# Patient Record
Sex: Female | Born: 1940
Health system: Southern US, Community
[De-identification: ages and names within clinical notes are randomized; demographics above are authoritative.]

## PROBLEM LIST (undated history)

## (undated) DIAGNOSIS — M1990 Unspecified osteoarthritis, unspecified site: Secondary | ICD-10-CM

## (undated) DIAGNOSIS — H269 Unspecified cataract: Secondary | ICD-10-CM

## (undated) DIAGNOSIS — R011 Cardiac murmur, unspecified: Secondary | ICD-10-CM

## (undated) DIAGNOSIS — T7840XA Allergy, unspecified, initial encounter: Secondary | ICD-10-CM

## (undated) DIAGNOSIS — M3500 Sicca syndrome, unspecified: Secondary | ICD-10-CM

## (undated) DIAGNOSIS — I061 Rheumatic aortic insufficiency: Secondary | ICD-10-CM

## (undated) HISTORY — DX: Unspecified osteoarthritis, unspecified site: M19.90

## (undated) HISTORY — DX: Allergy, unspecified, initial encounter: T78.40XA

## (undated) HISTORY — DX: Cardiac murmur, unspecified: R01.1

## (undated) HISTORY — PX: ABDOMINAL HYSTERECTOMY: SHX81

## (undated) HISTORY — DX: Unspecified cataract: H26.9

## (undated) HISTORY — DX: Sjogren syndrome, unspecified: M35.00

---

## 2005-02-21 ENCOUNTER — Emergency Department: Payer: Self-pay | Admitting: Emergency Medicine

## 2005-04-22 ENCOUNTER — Ambulatory Visit: Payer: Self-pay | Admitting: Internal Medicine

## 2005-07-19 HISTORY — PX: BREAST BIOPSY: SHX20

## 2005-11-03 ENCOUNTER — Ambulatory Visit: Payer: Self-pay | Admitting: Internal Medicine

## 2006-05-30 ENCOUNTER — Ambulatory Visit: Payer: Self-pay | Admitting: Family Medicine

## 2006-07-04 ENCOUNTER — Ambulatory Visit: Payer: Self-pay | Admitting: Internal Medicine

## 2006-08-01 ENCOUNTER — Ambulatory Visit: Payer: Self-pay

## 2007-09-21 ENCOUNTER — Ambulatory Visit: Payer: Self-pay | Admitting: Internal Medicine

## 2008-10-31 ENCOUNTER — Ambulatory Visit: Payer: Self-pay | Admitting: Internal Medicine

## 2010-03-22 ENCOUNTER — Emergency Department: Payer: Self-pay | Admitting: Unknown Physician Specialty

## 2010-03-22 ENCOUNTER — Ambulatory Visit: Payer: Self-pay | Admitting: Ophthalmology

## 2010-04-08 ENCOUNTER — Ambulatory Visit: Payer: Self-pay | Admitting: Internal Medicine

## 2011-08-04 ENCOUNTER — Ambulatory Visit: Payer: Self-pay | Admitting: Family Medicine

## 2011-08-10 ENCOUNTER — Ambulatory Visit: Payer: Self-pay | Admitting: Family Medicine

## 2011-08-30 ENCOUNTER — Ambulatory Visit: Payer: Self-pay | Admitting: Gastroenterology

## 2012-02-09 ENCOUNTER — Ambulatory Visit: Payer: Self-pay | Admitting: Family Medicine

## 2013-09-01 ENCOUNTER — Emergency Department: Payer: Self-pay | Admitting: Emergency Medicine

## 2013-11-12 ENCOUNTER — Ambulatory Visit (INDEPENDENT_AMBULATORY_CARE_PROVIDER_SITE_OTHER): Payer: Medicare Other | Admitting: Podiatry

## 2013-11-12 ENCOUNTER — Encounter: Payer: Self-pay | Admitting: Podiatry

## 2013-11-12 ENCOUNTER — Other Ambulatory Visit: Payer: Self-pay | Admitting: *Deleted

## 2013-11-12 ENCOUNTER — Ambulatory Visit (INDEPENDENT_AMBULATORY_CARE_PROVIDER_SITE_OTHER): Payer: Medicare Other

## 2013-11-12 VITALS — BP 118/64 | HR 94 | Resp 16 | Ht 58.5 in | Wt 106.0 lb

## 2013-11-12 DIAGNOSIS — M21619 Bunion of unspecified foot: Secondary | ICD-10-CM

## 2013-11-12 DIAGNOSIS — M7751 Other enthesopathy of right foot: Secondary | ICD-10-CM

## 2013-11-12 DIAGNOSIS — M7750 Other enthesopathy of unspecified foot: Secondary | ICD-10-CM

## 2013-11-12 DIAGNOSIS — M2040 Other hammer toe(s) (acquired), unspecified foot: Secondary | ICD-10-CM

## 2013-11-12 NOTE — Progress Notes (Signed)
   Subjective:    Patient ID: Angelica Whitney, female    DOB: 04-16-1941, 73 y.o.   MRN: [id number]  HPI Comments: i have a painful corn on my right 2nd toe . Also have bunions and hammertoes . Its been a while ,just getting worse      Review of Systems  Constitutional: Positive for activity change and appetite change.  All other systems reviewed and are negative.      Objective:   Physical Exam: I have reviewed her past history medications allergies surgeries social history and review of systems. Pulses are strongly palpable bilateral. Neurologic sensorium is intact per since once the monofilament. Deep tendon reflexes are intact and brisk bilateral. Muscle strength is 5 over 5 dorsiflexors plantar flexors inverters everters all intrinsic musculature is intact. Orthopedic evaluation does demonstrates severe pes planus bilateral moderate to severe hallux abductovalgus deformity bilateral. Overlapping second toe of the hallux left foot. She has a reactive hyperkeratosis capsulitis overlying the medial DIPJ of the second digit of the right foot digit is juxtaposition of the hallux. Radiographic evaluation does demonstrate severe pes planus. Osteoporosis at the very least osteopenia. And digital deformities.        Assessment & Plan:  Assessment: Severe foot deformity bilateral. Capsulitis DIPJ second digit right foot with overlying reactive hyperkeratosis.  Plan: Injected the area today with dexamethasone and local anesthetic debrided the reactive hyperkeratosis and placed padding discussed the possible need for surgical intervention.

## 2014-01-02 ENCOUNTER — Emergency Department: Payer: Self-pay | Admitting: Internal Medicine

## 2014-01-29 ENCOUNTER — Emergency Department: Payer: Self-pay | Admitting: Emergency Medicine

## 2014-03-21 ENCOUNTER — Emergency Department: Payer: Self-pay | Admitting: Emergency Medicine

## 2014-03-22 ENCOUNTER — Emergency Department: Payer: Self-pay | Admitting: Emergency Medicine

## 2014-03-24 ENCOUNTER — Emergency Department: Payer: Self-pay | Admitting: Emergency Medicine

## 2014-03-24 LAB — CBC
HCT: 35.8 % (ref 35.0–47.0)
HGB: 11.8 g/dL — ABNORMAL LOW (ref 12.0–16.0)
MCH: 33.7 pg (ref 26.0–34.0)
MCHC: 32.9 g/dL (ref 32.0–36.0)
MCV: 103 fL — ABNORMAL HIGH (ref 80–100)
Platelet: 238 10*3/uL (ref 150–440)
RBC: 3.49 10*6/uL — ABNORMAL LOW (ref 3.80–5.20)
RDW: 17.8 % — ABNORMAL HIGH (ref 11.5–14.5)
WBC: 7.5 10*3/uL (ref 3.6–11.0)

## 2014-03-24 LAB — BASIC METABOLIC PANEL
Anion Gap: 5 — ABNORMAL LOW (ref 7–16)
BUN: 9 mg/dL (ref 7–18)
CHLORIDE: 104 mmol/L (ref 98–107)
CO2: 26 mmol/L (ref 21–32)
Calcium, Total: 8.3 mg/dL — ABNORMAL LOW (ref 8.5–10.1)
Creatinine: 0.88 mg/dL (ref 0.60–1.30)
EGFR (African American): >60
Glucose: 100 mg/dL — ABNORMAL HIGH (ref 65–99)
Osmolality: 269 (ref 275–301)
Potassium: 3.8 mmol/L (ref 3.5–5.1)
Sodium: 135 mmol/L — ABNORMAL LOW (ref 136–145)

## 2014-03-24 LAB — TROPONIN I: Troponin-I: <0.02 ng/mL

## 2014-04-06 ENCOUNTER — Emergency Department: Payer: Self-pay | Admitting: Emergency Medicine

## 2014-05-13 ENCOUNTER — Ambulatory Visit: Payer: Self-pay | Admitting: Family Medicine

## 2015-06-26 ENCOUNTER — Other Ambulatory Visit: Payer: Self-pay | Admitting: Family Medicine

## 2015-06-26 DIAGNOSIS — Z1231 Encounter for screening mammogram for malignant neoplasm of breast: Secondary | ICD-10-CM

## 2015-07-03 DIAGNOSIS — M069 Rheumatoid arthritis, unspecified: Secondary | ICD-10-CM | POA: Insufficient documentation

## 2015-07-03 DIAGNOSIS — M3500 Sicca syndrome, unspecified: Secondary | ICD-10-CM | POA: Insufficient documentation

## 2015-07-08 ENCOUNTER — Ambulatory Visit
Admission: RE | Admit: 2015-07-08 | Discharge: 2015-07-08 | Disposition: A | Discharge status: Home / Self Care | Payer: PPO | Source: Ambulatory Visit | Attending: Family Medicine | Admitting: Family Medicine

## 2015-07-08 DIAGNOSIS — Z1231 Encounter for screening mammogram for malignant neoplasm of breast: Secondary | ICD-10-CM

## 2015-08-13 DIAGNOSIS — Z79899 Other long term (current) drug therapy: Secondary | ICD-10-CM | POA: Diagnosis not present

## 2015-08-13 DIAGNOSIS — M069 Rheumatoid arthritis, unspecified: Secondary | ICD-10-CM | POA: Diagnosis not present

## 2015-09-10 DIAGNOSIS — Z79899 Other long term (current) drug therapy: Secondary | ICD-10-CM | POA: Diagnosis not present

## 2015-09-10 DIAGNOSIS — M0579 Rheumatoid arthritis with rheumatoid factor of multiple sites without organ or systems involvement: Secondary | ICD-10-CM | POA: Diagnosis not present

## 2015-09-10 DIAGNOSIS — M3500 Sicca syndrome, unspecified: Secondary | ICD-10-CM | POA: Diagnosis not present

## 2015-09-10 DIAGNOSIS — M1990 Unspecified osteoarthritis, unspecified site: Secondary | ICD-10-CM | POA: Diagnosis not present

## 2015-09-10 DIAGNOSIS — I7300 Raynaud's syndrome without gangrene: Secondary | ICD-10-CM | POA: Diagnosis not present

## 2015-10-10 DIAGNOSIS — Z79899 Other long term (current) drug therapy: Secondary | ICD-10-CM | POA: Diagnosis not present

## 2015-10-10 DIAGNOSIS — M0579 Rheumatoid arthritis with rheumatoid factor of multiple sites without organ or systems involvement: Secondary | ICD-10-CM | POA: Diagnosis not present

## 2015-11-17 DIAGNOSIS — H16223 Keratoconjunctivitis sicca, not specified as Sjogren's, bilateral: Secondary | ICD-10-CM | POA: Diagnosis not present

## 2015-11-17 DIAGNOSIS — H04123 Dry eye syndrome of bilateral lacrimal glands: Secondary | ICD-10-CM | POA: Diagnosis not present

## 2015-11-17 DIAGNOSIS — H2513 Age-related nuclear cataract, bilateral: Secondary | ICD-10-CM | POA: Diagnosis not present

## 2015-11-17 DIAGNOSIS — E119 Type 2 diabetes mellitus without complications: Secondary | ICD-10-CM | POA: Diagnosis not present

## 2015-11-18 DIAGNOSIS — H2012 Chronic iridocyclitis, left eye: Secondary | ICD-10-CM | POA: Diagnosis not present

## 2015-12-23 DIAGNOSIS — R7303 Prediabetes: Secondary | ICD-10-CM | POA: Diagnosis not present

## 2015-12-23 DIAGNOSIS — Z8639 Personal history of other endocrine, nutritional and metabolic disease: Secondary | ICD-10-CM | POA: Diagnosis not present

## 2015-12-23 DIAGNOSIS — E782 Mixed hyperlipidemia: Secondary | ICD-10-CM | POA: Diagnosis not present

## 2015-12-25 DIAGNOSIS — R7303 Prediabetes: Secondary | ICD-10-CM | POA: Diagnosis not present

## 2015-12-25 DIAGNOSIS — E7800 Pure hypercholesterolemia, unspecified: Secondary | ICD-10-CM | POA: Diagnosis not present

## 2015-12-25 DIAGNOSIS — Z8639 Personal history of other endocrine, nutritional and metabolic disease: Secondary | ICD-10-CM | POA: Diagnosis not present

## 2016-01-15 DIAGNOSIS — M3500 Sicca syndrome, unspecified: Secondary | ICD-10-CM | POA: Diagnosis not present

## 2016-01-15 DIAGNOSIS — I7300 Raynaud's syndrome without gangrene: Secondary | ICD-10-CM | POA: Diagnosis not present

## 2016-01-15 DIAGNOSIS — Z7952 Long term (current) use of systemic steroids: Secondary | ICD-10-CM | POA: Diagnosis not present

## 2016-01-15 DIAGNOSIS — E7800 Pure hypercholesterolemia, unspecified: Secondary | ICD-10-CM | POA: Diagnosis not present

## 2016-01-15 DIAGNOSIS — R700 Elevated erythrocyte sedimentation rate: Secondary | ICD-10-CM | POA: Diagnosis not present

## 2016-01-15 DIAGNOSIS — M0579 Rheumatoid arthritis with rheumatoid factor of multiple sites without organ or systems involvement: Secondary | ICD-10-CM | POA: Diagnosis not present

## 2016-01-15 DIAGNOSIS — R7303 Prediabetes: Secondary | ICD-10-CM | POA: Diagnosis not present

## 2016-02-06 DIAGNOSIS — R300 Dysuria: Secondary | ICD-10-CM | POA: Diagnosis not present

## 2016-04-07 DIAGNOSIS — L03119 Cellulitis of unspecified part of limb: Secondary | ICD-10-CM | POA: Diagnosis not present

## 2016-04-07 DIAGNOSIS — L02619 Cutaneous abscess of unspecified foot: Secondary | ICD-10-CM | POA: Diagnosis not present

## 2016-04-07 DIAGNOSIS — M2011 Hallux valgus (acquired), right foot: Secondary | ICD-10-CM | POA: Diagnosis not present

## 2016-04-07 DIAGNOSIS — L97511 Non-pressure chronic ulcer of other part of right foot limited to breakdown of skin: Secondary | ICD-10-CM | POA: Diagnosis not present

## 2016-04-21 DIAGNOSIS — L02619 Cutaneous abscess of unspecified foot: Secondary | ICD-10-CM | POA: Diagnosis not present

## 2016-04-21 DIAGNOSIS — L97511 Non-pressure chronic ulcer of other part of right foot limited to breakdown of skin: Secondary | ICD-10-CM | POA: Diagnosis not present

## 2016-04-21 DIAGNOSIS — L03119 Cellulitis of unspecified part of limb: Secondary | ICD-10-CM | POA: Diagnosis not present

## 2016-05-05 DIAGNOSIS — M79674 Pain in right toe(s): Secondary | ICD-10-CM | POA: Diagnosis not present

## 2016-05-05 DIAGNOSIS — L02619 Cutaneous abscess of unspecified foot: Secondary | ICD-10-CM | POA: Diagnosis not present

## 2016-05-05 DIAGNOSIS — M79675 Pain in left toe(s): Secondary | ICD-10-CM | POA: Diagnosis not present

## 2016-05-05 DIAGNOSIS — B351 Tinea unguium: Secondary | ICD-10-CM | POA: Diagnosis not present

## 2016-05-05 DIAGNOSIS — L03119 Cellulitis of unspecified part of limb: Secondary | ICD-10-CM | POA: Diagnosis not present

## 2016-05-19 DIAGNOSIS — L97511 Non-pressure chronic ulcer of other part of right foot limited to breakdown of skin: Secondary | ICD-10-CM | POA: Diagnosis not present

## 2016-06-07 DIAGNOSIS — M79671 Pain in right foot: Secondary | ICD-10-CM | POA: Diagnosis not present

## 2016-06-07 DIAGNOSIS — M67479 Ganglion, unspecified ankle and foot: Secondary | ICD-10-CM | POA: Diagnosis not present

## 2016-06-07 DIAGNOSIS — L97511 Non-pressure chronic ulcer of other part of right foot limited to breakdown of skin: Secondary | ICD-10-CM | POA: Diagnosis not present

## 2016-06-14 DIAGNOSIS — R7303 Prediabetes: Secondary | ICD-10-CM | POA: Diagnosis not present

## 2016-06-14 DIAGNOSIS — Z8639 Personal history of other endocrine, nutritional and metabolic disease: Secondary | ICD-10-CM | POA: Diagnosis not present

## 2016-06-14 DIAGNOSIS — E7800 Pure hypercholesterolemia, unspecified: Secondary | ICD-10-CM | POA: Diagnosis not present

## 2016-06-15 ENCOUNTER — Other Ambulatory Visit: Payer: Self-pay | Admitting: Family Medicine

## 2016-06-15 DIAGNOSIS — Z1231 Encounter for screening mammogram for malignant neoplasm of breast: Secondary | ICD-10-CM

## 2016-06-21 ENCOUNTER — Inpatient Hospital Stay: Admission: RE | Admit: 2016-06-21 | Payer: PPO | Source: Ambulatory Visit

## 2016-06-23 ENCOUNTER — Encounter: Payer: PPO | Attending: Internal Medicine | Admitting: Internal Medicine

## 2016-06-23 DIAGNOSIS — M3500 Sicca syndrome, unspecified: Secondary | ICD-10-CM | POA: Diagnosis not present

## 2016-06-23 DIAGNOSIS — M67471 Ganglion, right ankle and foot: Secondary | ICD-10-CM | POA: Diagnosis not present

## 2016-06-23 DIAGNOSIS — Z885 Allergy status to narcotic agent status: Secondary | ICD-10-CM | POA: Insufficient documentation

## 2016-06-23 DIAGNOSIS — M064 Inflammatory polyarthropathy: Secondary | ICD-10-CM | POA: Diagnosis not present

## 2016-06-23 DIAGNOSIS — D869 Sarcoidosis, unspecified: Secondary | ICD-10-CM | POA: Insufficient documentation

## 2016-06-23 DIAGNOSIS — M21611 Bunion of right foot: Secondary | ICD-10-CM | POA: Insufficient documentation

## 2016-06-23 DIAGNOSIS — E785 Hyperlipidemia, unspecified: Secondary | ICD-10-CM | POA: Diagnosis not present

## 2016-06-23 DIAGNOSIS — M2011 Hallux valgus (acquired), right foot: Secondary | ICD-10-CM | POA: Diagnosis not present

## 2016-06-24 NOTE — Progress Notes (Signed)
GARNETT, GOERTZ ([id number]) Visit Report for 06/23/2016 Chief Complaint Document Details Patient Name: Angelica Whitney, Whitney. Date of Service: 06/23/2016 8:00 AM Medical Record Patient Account Number: [id number] Number: Treating RN: Curtis Sites 10/21/1940 (75 y.o. Other Clinician: Date of Birth/Sex: Female) Treating Jeanette Moffatt Primary Care Physician/Extender: Dixie Dials Physician: Referring Physician: Valera Castle in Treatment: 0 Information Obtained from: Patient Chief Complaint 06/23/16; patient is here for review of a swelling on the lateral aspect of her right great toe, second opinion on same Electronic Signature(s) Signed: 06/23/2016 4:39:43 PM By: Baltazar Najjar MD Entered By: Baltazar Najjar on 06/23/2016 09:22:18 [name], [name] ([id number]) -------------------------------------------------------------------------------- HPI Details Patient Name: Angelica Whitney Whitney. Date of Service: 06/23/2016 8:00 AM Medical Record Patient Account Number: [id number] Number: Treating RN: Curtis Sites [birth year] (75 y.o. Other Clinician: Date of Birth/Sex: Female) Treating Akanksha Bellmore Primary Care Physician/Extender: Dixie Dials Physician: Referring Physician: Valera Castle in Treatment: 0 History of Present Illness HPI Description: 06/23/16; this is a patient who has rheumatoid arthritis and chronic forefoot deformities. She has a hallucis valgus deformity with a bunion at her right first metatarsal phalangeal joint. She also has this on the left but is not had any problem in that area. Over the last several months she is started developed cystic swelling and pain over this area. She developed an ulcer over the lateral aspect of her first metatarsal head on the right She has been followed by her local podiatrist Dr. Liz Beach. She is apparently he had fluid aspiration of this area and cultures although I don't actually  see the results. I see that definitive plans had been made for operative removal of this area however the patient canceled this. The surgery was originally scheduled for the middle of this month. ABIs in this clinic were 0.93. The patient does not have an open area currently although she is complaining of daily lancinating pain in this area. She has cut out the sides of all of her shoes to offload the area. She has had previous surgery on the right fifth toe many years ago. Asian is a borderline diabetic Electronic Signature(s) Signed: 06/23/2016 4:39:43 PM By: Baltazar Najjar MD Entered By: Baltazar Najjar on 06/23/2016 28:78:67 Angelica Whitney Whitney ([id number]) -------------------------------------------------------------------------------- Physical Exam Details Patient Name: Angelica Whitney, Whitney. Date of Service: 06/23/2016 8:00 AM Medical Record Patient Account Number: [id number] Number: Treating RN: Curtis Sites 10-Nov-1940 (75 y.o. Other Clinician: Date of Birth/Sex: Female) Treating Draxton Luu Primary Care Physician/Extender: Dixie Dials Physician: Referring Physician: Valera Castle in Treatment: 0 Constitutional Sitting or standing Blood Pressure is within target range for patient.. Pulse regular and within target range for patient.Marland Kitchen Respirations regular, non-labored and within target range.. Temperature is normal and within the target range for the patient.. H and appears well. Eyes Conjunctivae clear. No discharge.. Ears, Nose, Mouth, and Throat Patient appears to have normal oral mucosa. Respiratory Respiratory effort is easy and symmetric bilaterally. Rate is normal at rest and on room air.. Bilateral breath sounds are clear and equal in all lobes with no wheezes, rales or rhonchi.. Cardiovascular She states she hasn't insufficiency murmur although I did not hear at. Pedal pulses palpable and strong bilaterally.. No changes of venous  insufficiency.. Gastrointestinal (GI) Abdomen is soft and non-distended without masses or tenderness. Bowel sounds active in all quadrants.. Lymphatic None palpable in the popliteal or inguinal area. Musculoskeletal Patient has rheumatoid changes in the left greater than right forefoot with overriding of  the second over the first toe on the left.. Notes Physical exam; the patient has hallucis valgus of the right first toe. On the side of the first metatarsophalangeal is clearly a cystic swelling this is nontender. There is no surrounding erythema no evidence currently of infection. This is probably a ganglion cyst as was previously diagnosed by podiatry. There is no open wound here although I gather there has been one in the past Electronic Signature(s) Signed: 06/23/2016 4:39:43 PM By: Baltazar Najjar MD Entered By: Baltazar Najjar on 06/23/2016 09:43:46 Angelica Whitney Whitney ([id number]) -------------------------------------------------------------------------------- Physician Orders Details Patient Name: Angelica Whitney, Whitney. Date of Service: 06/23/2016 8:00 AM Medical Record Patient Account Number: [id number] Number: Treating RN: Curtis Sites 18-Nov-1940 (75 y.o. Other Clinician: Date of Birth/Sex: Female) Treating Jerrianne Hartin Primary Care Physician/Extender: Dixie Dials Physician: Referring Physician: Valera Castle in Treatment: 0 Verbal / Phone Orders: Yes Clinician: Curtis Sites Read Back and Verified: Yes Diagnosis Coding Discharge From [facility] o Discharge from [facility] - follow up with Dr Alberteen Spindle for bunion/cyst Electronic Signature(s) Signed: 06/23/2016 4:39:21 PM By: Curtis Sites Signed: 06/23/2016 4:39:43 PM By: Baltazar Najjar MD Entered By: Curtis Sites on 06/23/2016 08:46:04 Angelica Whitney Whitney ([id number]) -------------------------------------------------------------------------------- Problem List Details Patient Name:  Angelica Whitney, Whitney. Date of Service: 06/23/2016 8:00 AM Medical Record Patient Account Number: [id number] Number: Treating RN: Curtis Sites 1940/08/01 (75 y.o. Other Clinician: Date of Birth/Sex: Female) Treating Burhanuddin Kohlmann Primary Care Physician/Extender: Dixie Dials Physician: Referring Physician: Valera Castle in Treatment: 0 Active Problems ICD-10 Encounter Code Description Active Date Diagnosis M67.471 Ganglion, right ankle and foot 06/23/2016 Yes M21.611 Bunion of right foot 06/23/2016 Yes Inactive Problems Resolved Problems Electronic Signature(s) Signed: 06/23/2016 4:39:43 PM By: Baltazar Najjar MD Entered By: Baltazar Najjar on 06/23/2016 09:21:43 Angelica Whitney Whitney ([id number]) -------------------------------------------------------------------------------- Progress Note Details Patient Name: Angelica Whitney Whitney. Date of Service: 06/23/2016 8:00 AM Medical Record Patient Account Number: [id number] Number: Treating RN: Curtis Sites Jun 15, 1941 (75 y.o. Other Clinician: Date of Birth/Sex: Female) Treating Catalina Salasar Primary Care Physician/Extender: Dixie Dials Physician: Referring Physician: Valera Castle in Treatment: 0 Subjective Chief Complaint Information obtained from Patient 06/23/16; patient is here for review of a swelling on the lateral aspect of her right great toe, second opinion on same History of Present Illness (HPI) 06/23/16; this is a patient who has rheumatoid arthritis and chronic forefoot deformities. She has a hallucis valgus deformity with a bunion at her right first metatarsal phalangeal joint. She also has this on the left but is not had any problem in that area. Over the last several months she is started developed cystic swelling and pain over this area. She developed an ulcer over the lateral aspect of her first metatarsal head on the right She has been followed by her local  podiatrist Dr. Liz Beach. She is apparently he had fluid aspiration of this area and cultures although I don't actually see the results. I see that definitive plans had been made for operative removal of this area however the patient canceled this. The surgery was originally scheduled for the middle of this month. ABIs in this clinic were 0.93. The patient does not have an open area currently although she is complaining of daily lancinating pain in this area. She has cut out the sides of all of her shoes to offload the area. She has had previous surgery on the right fifth toe many years ago. Asian is a borderline diabetic  Patient History Information obtained from Patient. Allergies codeine (Reaction: nausea), caffeine (Reaction: keeps me awake), Levaquin Social History Never smoker, Marital Status - Divorced, Alcohol Use - Never, Drug Use - No History, Caffeine Use - Never. Medical History Immunological Patient has history of Raynaud s Musculoskeletal Patient has history of Rheumatoid Arthritis, Osteoarthritis - inflammatory arthritis Angelica Whitney, Editha K. ([id number]) Medical And Surgical History Notes Cardiovascular hyperlipidemia Endocrine borderline DMII - A1C 5.8 12/23/15 Neurologic Sjogren's syndrome, sarcoidosis Review of Systems (ROS) Constitutional Symptoms (General Health) The patient has no complaints or symptoms. Eyes The patient has no complaints or symptoms. Ear/Nose/Mouth/Throat The patient has no complaints or symptoms. Hematologic/Lymphatic The patient has no complaints or symptoms. Respiratory The patient has no complaints or symptoms. Cardiovascular The patient has no complaints or symptoms. Gastrointestinal The patient has no complaints or symptoms. Endocrine The patient has no complaints or symptoms. Genitourinary The patient has no complaints or symptoms. Immunological The patient has no complaints or symptoms. Integumentary (Skin) The patient has no  complaints or symptoms. Musculoskeletal The patient has no complaints or symptoms. Neurologic The patient has no complaints or symptoms. Oncologic The patient has no complaints or symptoms. Psychiatric The patient has no complaints or symptoms. Objective Constitutional Sitting or standing Blood Pressure is within target range for patient.. Pulse regular and within target range Angelica Whitney, Evelyn K. ([id number]) for patient.Marland Kitchen. Respirations regular, non-labored and within target range.. Temperature is normal and within the target range for the patient.. H and appears well. Vitals Time Taken: 8:13 AM, Height: 59 in, Source: Stated, Weight: 98 lbs, Source: Stated, BMI: 19.8, Temperature: 98.0 F, Pulse: 78 bpm, Respiratory Rate: 18 breaths/min, Blood Pressure: 125/59 mmHg. Eyes Conjunctivae clear. No discharge.. Ears, Nose, Mouth, and Throat Patient appears to have normal oral mucosa. Respiratory Respiratory effort is easy and symmetric bilaterally. Rate is normal at rest and on room air.. Bilateral breath sounds are clear and equal in all lobes with no wheezes, rales or rhonchi.. Cardiovascular She states she hasn't insufficiency murmur although I did not hear at. Pedal pulses palpable and strong bilaterally.. No changes of venous insufficiency.. Gastrointestinal (GI) Abdomen is soft and non-distended without masses or tenderness. Bowel sounds active in all quadrants.. Lymphatic None palpable in the popliteal or inguinal area. Musculoskeletal Patient has rheumatoid changes in the left greater than right forefoot with overriding of the second over the first toe on the left.. General Notes: Physical exam; the patient has hallucis valgus of the right first toe. On the side of the first metatarsophalangeal is clearly a cystic swelling this is nontender. There is no surrounding erythema no evidence currently of infection. This is probably a ganglion cyst as was previously diagnosed by  podiatry. There is no open wound here although I gather there has been one in the past Assessment Active Problems ICD-10 M67.471 - Ganglion, right ankle and foot M21.611 - Bunion of right foot Angelica Whitney NewcomerVANS, Arianis K. 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 Now with a cystic swelling over the top of this. I reviewed with her podiatrist notes. This was felt to be infected when she first came to the clinic in October. Fluid was aspirated at least once. She also appeared to have an open wound over this area although she has none now. X-ray revealed hallucis valgus deformity. Some increase in soft tissue contour in density. No evidence of any fracture or destructive lesions.  This was done in the podiatry office. #2 patient continues to complain of pain and some tenderness in this area. She has cut out the relevant part of her shoes. It sounds as though this has been problematic enough to warrant surgical excision of the cyst as had been previously planned. I see really no reason to delay this although it does not have any particular urgency. #3 all of this explained to the patient in detail. She does not have a wound currently I think she is getting good care in the podiatrist's office. I think we reassured her and answered all her questions. There is no reason for her to follow here Electronic Signature(s) Signed: 06/23/2016 4:39:43 PM By: Baltazar Najjar MD Entered By: Baltazar Najjar on 06/23/2016 09:46:03 Angelica Whitney Whitney ([id number]) -------------------------------------------------------------------------------- ROS/PFSH Details Patient Name: Angelica Whitney, Whitney. Date of Service: 06/23/2016 8:00 AM Medical Record Patient Account Number: [id number] Number: Treating RN: Curtis Sites [birth year] (75 y.o. Other Clinician: Date of  Birth/Sex: Female) Treating Jannine Abreu Primary Care Physician/Extender: Dixie Dials Physician: Referring Physician: Valera Castle in Treatment: 0 Information Obtained From Patient Wound History Do you currently have one or more open woundso No Constitutional Symptoms (General Health) Complaints and Symptoms: No Complaints or Symptoms Eyes Complaints and Symptoms: No Complaints or Symptoms Ear/Nose/Mouth/Throat Complaints and Symptoms: No Complaints or Symptoms Hematologic/Lymphatic Complaints and Symptoms: No Complaints or Symptoms Respiratory Complaints and Symptoms: No Complaints or Symptoms Cardiovascular Complaints and Symptoms: No Complaints or Symptoms Medical History: Past Medical History Notes: hyperlipidemia Gastrointestinal Angelica Whitney, Kristianne K. ([id number]) Complaints and Symptoms: No Complaints or Symptoms Endocrine Complaints and Symptoms: No Complaints or Symptoms Medical History: Past Medical History Notes: borderline DMII - A1C 5.8 12/23/15 Genitourinary Complaints and Symptoms: No Complaints or Symptoms Immunological Complaints and Symptoms: No Complaints or Symptoms Medical History: Positive for: Raynaudos Integumentary (Skin) Complaints and Symptoms: No Complaints or Symptoms Musculoskeletal Complaints and Symptoms: No Complaints or Symptoms Medical History: Positive for: Rheumatoid Arthritis; Osteoarthritis - inflammatory arthritis Neurologic Complaints and Symptoms: No Complaints or Symptoms Medical History: Past Medical History Notes: Sjogren's syndrome, sarcoidosis Oncologic Complaints and Symptoms: No Complaints or Symptoms Angelica Whitney Whitney, Dazhane K. ([id number]) Psychiatric Complaints and Symptoms: No Complaints or Symptoms Immunizations Pneumococcal Vaccine: Received Pneumococcal Vaccination: Yes Immunization Notes: up to date Family and Social History Never smoker; Marital Status - Divorced; Alcohol Use:  Never; Drug Use: No History; Caffeine Use: Never; Financial Concerns: No; Food, Clothing or Shelter Needs: No; Support System Lacking: No; Transportation Concerns: No; Advanced Directives: No; Patient does not want information on Advanced Directives Electronic Signature(s) Signed: 06/23/2016 4:39:21 PM By: Curtis Sites Signed: 06/23/2016 4:39:43 PM By: Baltazar Najjar MD Entered By: Curtis Sites on 06/23/2016 08:21:46 Bagnall, [name] ([id number]) -------------------------------------------------------------------------------- SuperBill Details Patient Name: Angelica Whitney Whitney. Date of Service: 06/23/2016 Medical Record Patient Account Number: [id number] Number: Treating RN: Curtis Sites 12/14/1940 (75 y.o. Other Clinician: Date of Birth/Sex: Female) Treating Jacobie Stamey Primary Care Physician/Extender: Dixie Dials Physician: Weeks in Treatment: 0 Referring Physician: Marisue Ivan Diagnosis Coding ICD-10 Codes Code Description M67.471 Ganglion, right ankle and foot M21.611 Bunion of right foot Facility Procedures CPT4 Code: [id number] Description: 99213 - WOUND CARE VISIT-LEV 3 EST PT Modifier: Quantity: 1 Physician Procedures CPT4 Code: [id number] Description: WC PHYS LEVEL 3 o NEW PT ICD-10 Description Diagnosis M67.471 Ganglion, right ankle and foot M21.611 Bunion of right foot Modifier: Quantity: 1 Electronic Signature(s) Signed: 06/23/2016 4:39:43 PM By: Baltazar Najjar MD Entered By: Baltazar Najjar on 06/23/2016 09:47:17

## 2016-06-24 NOTE — Progress Notes (Signed)
Angelica Whitney ([id number]) Visit Report for 06/23/2016 Allergy List Details Patient Name: Angelica Whitney, Angelica Whitney. Date of Service: 06/23/2016 8:00 AM Medical Record Patient Account Number: [id number] Number: Treating RN: Curtis Sites October 25, 1940 (75 y.o. Other Clinician: Date of Birth/Sex: Female) Treating ROBSON, MICHAEL Primary Care Physician/Extender: Dixie Dials Physician: Referring Physician: Valera Castle in Treatment: 0 Allergies Active Allergies codeine Reaction: nausea caffeine Reaction: keeps me awake Levaquin Allergy Notes Electronic Signature(s) Signed: 06/23/2016 4:39:21 PM By: Curtis Sites Entered By: Curtis Sites on 06/23/2016 08:15:42 Aspinall, Trinna Balloon ([id number]) -------------------------------------------------------------------------------- Arrival Information Details Patient Name: Angelica Whitney. Date of Service: 06/23/2016 8:00 AM Medical Record Patient Account Number: [id number] Number: Treating RN: Curtis Sites April 05, 1941 (75 y.o. Other Clinician: Date of Birth/Sex: Female) Treating ROBSON, MICHAEL Primary Care Physician/Extender: Dixie Dials Physician: Referring Physician: Valera Castle in Treatment: 0 Visit Information Patient Arrived: Ambulatory Arrival Time: 08:06 Accompanied By: self Transfer Assistance: None Patient Identification Verified: Yes Secondary Verification Process Yes Completed: Electronic Signature(s) Signed: 06/23/2016 4:39:21 PM By: Curtis Sites Entered By: Curtis Sites on 06/23/2016 08:09:55 Schlotterbeck, Trinna Balloon ([id number]) -------------------------------------------------------------------------------- Clinic Level of Care Assessment Details Patient Name: Angelica Whitney. Date of Service: 06/23/2016 8:00 AM Medical Record Patient Account Number: [id number] Number: Treating RN: Curtis Sites [birth year] (75 y.o. Other Clinician: Date of  Birth/Sex: Female) Treating ROBSON, MICHAEL Primary Care Physician/Extender: Dixie Dials Physician: Referring Physician: Valera Castle in Treatment: 0 Clinic Level of Care Assessment Items TOOL 2 Quantity Score []  - Use when only an EandM is performed on the INITIAL visit 0 ASSESSMENTS - Nursing Assessment / Reassessment X - General Physical Exam (combine w/ comprehensive assessment (listed just 1 20 below) when performed on new pt. evals) X - Comprehensive Assessment (HX, ROS, Risk Assessments, Wounds Hx, etc.) 1 25 ASSESSMENTS - Wound and Skin Assessment / Reassessment []  - Simple Wound Assessment / Reassessment - one wound 0 []  - Complex Wound Assessment / Reassessment - multiple wounds 0 X - Dermatologic / Skin Assessment (not related to wound area) 1 10 ASSESSMENTS - Ostomy and/or Continence Assessment and Care []  - Incontinence Assessment and Management 0 []  - Ostomy Care Assessment and Management (repouching, etc.) 0 PROCESS - Coordination of Care X - Simple Patient / Family Education for ongoing care 1 15 []  - Complex (extensive) Patient / Family Education for ongoing care 0 []  - Staff obtains Chiropractor, Records, Test Results / Process Orders 0 []  - Staff telephones HHA, Nursing Homes / Clarify orders / etc 0 []  - Routine Transfer to another Facility (non-emergent condition) 0 []  - Routine Hospital Admission (non-emergent condition) 0 X - New Admissions / Manufacturing engineer / Ordering NPWT, Apligraf, etc. 1 15 Barona, Tiarrah K. ([id number]) []  - Emergency Hospital Admission (emergent condition) 0 X - Simple Discharge Coordination 1 10 []  - Complex (extensive) Discharge Coordination 0 PROCESS - Special Needs []  - Pediatric / Minor Patient Management 0 []  - Isolation Patient Management 0 []  - Hearing / Language / Visual special needs 0 []  - Assessment of Community assistance (transportation, D/C planning, etc.) 0 []  - Additional assistance / Altered  mentation 0 []  - Support Surface(s) Assessment (bed, cushion, seat, etc.) 0 INTERVENTIONS - Wound Cleansing / Measurement []  - Wound Imaging (photographs - any number of wounds) 0 []  - Wound Tracing (instead of photographs) 0 []  - Simple Wound Measurement - one wound 0 []  - Complex Wound Measurement - multiple wounds 0 []  - Simple Wound Cleansing -  one wound 0 []  - Complex Wound Cleansing - multiple wounds 0 INTERVENTIONS - Wound Dressings []  - Small Wound Dressing one or multiple wounds 0 []  - Medium Wound Dressing one or multiple wounds 0 []  - Large Wound Dressing one or multiple wounds 0 []  - Application of Medications - injection 0 INTERVENTIONS - Miscellaneous []  - External ear exam 0 []  - Specimen Collection (cultures, biopsies, blood, body fluids, etc.) 0 []  - Specimen(s) / Culture(s) sent or taken to Lab for analysis 0 []  - Patient Transfer (multiple staff / Lift / Similar devices) 0 Kleier, Averi K. ( ) []  - Simple Staple / Suture removal (25 or less) 0 []  - Complex Staple / Suture removal (26 or more) 0 []  - Hypo / Hyperglycemic Management (close monitor of Blood Glucose) 0 X - Ankle / Brachial Index (ABI) - do not check if billed separately 1 15 Has the patient been seen at the hospital within the last three years: Yes Total Score: 110 Level Of Care: New/Established - Level 3 Electronic Signature(s) Signed: 06/23/2016 4:39:21 PM By: Entered By: on 06/23/2016 08:44:37 Mcminn, ( ) -------------------------------------------------------------------------------- Encounter Discharge Information Details Patient Name: . Date of Service: 06/23/2016 8:00 AM Medical Record Patient Account Number: [id number] Number: Treating RN: 11/16/1940 (75 y.o. Other Clinician: Date of Birth/Sex: Female) Treating ROBSON, MICHAEL Primary Care Physician/Extender: Curtis Sites Physician: Referring Physician: Curtis Sites in Treatment: 0 Encounter Discharge Information Items Discharge Pain Level: 0 Discharge Condition: Stable Ambulatory Status: Ambulatory Discharge Destination: Home Transportation: Private Auto Accompanied By: self Schedule Follow-up Appointment: No Medication Reconciliation completed and provided to Patient/Care No Ekaterini Capitano: Provided on Clinical Summary of Care: 06/23/2016 Form Type Recipient Paper Patient GE Electronic Signature(s) Signed: 06/23/2016 8:51:02 AM By: [id number] Entered By: Angelica Whitney on 06/23/2016 08:51:02 Leask, [id number] ([id number]) -------------------------------------------------------------------------------- General Visit Notes Details Patient Name: Curtis Sites. Date of Service: 06/23/2016 8:00 AM Medical Record Patient Account Number: 05-17-1984 Dixie Dials Number: Treating RN: Valera Castle 06-25-1941 (75 y.o. Other Clinician: Date of Birth/Sex: Female) Treating ROBSON, MICHAEL Primary Care Physician/Extender: Gwenlyn Perking Physician: Referring Physician: Gwenlyn Perking in Treatment: 0 Notes Patient does not have an open wound. She had a bunion on her right foot that has an inflamed area on it that Dr 14/12/2015 has been draining and would like to do surgery to correct. Patient wanted a second opinion before surgery. She has a friend who stated to her that the [facility] can heal anything so she decided to come to Angelica Whitney. Electronic Signature(s) Signed: 06/23/2016 4:39:21 PM By: [id number] Entered By: [id number] on 06/23/2016 08:41:33 Candelaria, 10/14/1940 (05-17-1984) -------------------------------------------------------------------------------- Lower Extremity Assessment Details Patient Name: HELINA, HULLUM. Date of Service: 06/23/2016 8:00 AM Medical Record Patient Account Number: Alberteen Spindle [facility] Number: Treating RN: [facility] 1940/12/29 (75 y.o.  Other Clinician: Date of Birth/Sex: Female) Treating ROBSON, MICHAEL Primary Care Physician/Extender: Curtis Sites Physician: Referring Physician: Curtis Sites in Treatment: 0 Edema Assessment Assessed: [Left: No] [Right: No] Edema: [Left: N] [Right: o] Vascular Assessment Pulses: Posterior Tibial Palpable: [Right:No] Doppler: [Right:Monophasic] Dorsalis Pedis Palpable: [Right:Yes] Doppler: [Right:Multiphasic] Extremity colors, hair growth, and conditions: Extremity Color: [Right:Normal] Hair Growth on Extremity: [Right:No] Temperature of Extremity: [Right:Warm] Capillary Refill: [Right:< 3 seconds] Blood Pressure: Brachial: [Right:120] Dorsalis Pedis: [Left:Dorsalis Pedis: 112] Ankle: Posterior Tibial: [Left:Posterior Tibial:] [Right:0.93] Toe Nail Assessment Left: Right: Thick: Yes Discolored: No Deformed: No Improper Length and Hygiene:  No Electronic Signature(s) Signed: 06/23/2016 4:39:21 PM By: Curtis Sites Entered By: Curtis Sites on 06/23/2016 08:25:44 Mattia, Trinna Balloon ([id number]) Logan Bores, Trinna Balloon ([id number]) -------------------------------------------------------------------------------- Pain Assessment Details Patient Name: AANIKA, DEFOOR K. Date of Service: 06/23/2016 8:00 AM Medical Record Patient Account Number: [id number] Number: Treating RN: Curtis Sites 1940/09/09 (75 y.o. Other Clinician: Date of Birth/Sex: Female) Treating ROBSON, MICHAEL Primary Care Physician/Extender: Dixie Dials Physician: Referring Physician: Valera Castle in Treatment: 0 Active Problems Location of Pain Severity and Description of Pain Patient Has Paino No Site Locations Pain Management and Medication Current Pain Management: Notes Topical or injectable lidocaine is offered to patient for acute pain when surgical debridement is performed. If needed, Patient is instructed to use over the counter pain medication  for the following 24-48 hours after debridement. Wound care MDs do not prescribed pain medications. Patient has chronic pain or uncontrolled pain. Patient has been instructed to make an appointment with their Primary Care Physician for pain management. Electronic Signature(s) Signed: 06/23/2016 4:39:21 PM By: Curtis Sites Entered By: Curtis Sites on 06/23/2016 08:13:03 Gross, Trinna Balloon ([id number]) -------------------------------------------------------------------------------- Patient/Caregiver Education Details Patient Name: Angelica Whitney. Date of Service: 06/23/2016 8:00 AM Medical Record Patient Account Number: [id number] Number: Treating RN: Curtis Sites 1941/01/31 (75 y.o. Other Clinician: Date of Birth/Gender: Female) Treating ROBSON, MICHAEL Primary Care Physician/Extender: Dixie Dials Physician: Tania Ade in Treatment: 0 Referring Physician: Marisue Ivan Education Assessment Education Provided To: Patient Education Topics Provided Wound/Skin Impairment: Handouts: Other: f/u with Dr Alberteen Spindle for bunion/cyst repair Methods: Explain/Verbal Responses: State content correctly Electronic Signature(s) Signed: 06/23/2016 4:39:21 PM By: Curtis Sites Entered By: Curtis Sites on 06/23/2016 08:45:31 Dentinger, Trinna Balloon ([id number]) -------------------------------------------------------------------------------- Vitals Details Patient Name: Angelica Whitney. Date of Service: 06/23/2016 8:00 AM Medical Record Patient Account Number: [id number] Number: Treating RN: Curtis Sites 11-24-1940 (75 y.o. Other Clinician: Date of Birth/Sex: Female) Treating ROBSON, MICHAEL Primary Care Physician/Extender: Dixie Dials Physician: Referring Physician: Valera Castle in Treatment: 0 Vital Signs Time Taken: 08:13 Temperature (F): 98.0 Height (in): 59 Pulse (bpm): 78 Source: Stated Respiratory Rate (breaths/min): 18 Weight  (lbs): 98 Blood Pressure (mmHg): 125/59 Source: Stated Reference Range: 80 - 120 mg / dl Body Mass Index (BMI): 19.8 Electronic Signature(s) Signed: 06/23/2016 4:39:21 PM By: Curtis Sites Entered By: Curtis Sites on 06/23/2016 08:14:12

## 2016-06-24 NOTE — Progress Notes (Signed)
Angelica Whitney, Angelica Whitney ([id number]) Visit Report for 06/23/2016 Abuse/Suicide Risk Screen Details Patient Name: Angelica Whitney, Angelica Whitney. Date of Service: 06/23/2016 8:00 AM Medical Record Patient Account Number: [id number] Number: Treating RN: Curtis Sites 06/04/1941 (75 y.o. Other Clinician: Date of Birth/Sex: Female) Treating ROBSON, MICHAEL Primary Care Physician/Extender: Dixie Dials Physician: Referring Physician: Valera Castle in Treatment: 0 Abuse/Suicide Risk Screen Items Answer ABUSE/SUICIDE RISK SCREEN: Has anyone close to you tried to hurt or harm you recentlyo No Do you feel uncomfortable with anyone in your familyo No Has anyone forced you do things that you didnot want to doo No Do you have any thoughts of harming yourselfo No Patient displays signs or symptoms of abuse and/or neglect. No Electronic Signature(s) Signed: 06/23/2016 4:39:21 PM By: Curtis Sites Entered By: Curtis Sites on 06/23/2016 08:15:51 Thorns, Trinna Balloon ([id number]) -------------------------------------------------------------------------------- Activities of Daily Living Details Patient Name: Angelica, Whitney. Date of Service: 06/23/2016 8:00 AM Medical Record Patient Account Number: [id number] Number: Treating RN: Curtis Sites [birth year] (75 y.o. Other Clinician: Date of Birth/Sex: Female) Treating ROBSON, MICHAEL Primary Care Physician/Extender: Dixie Dials Physician: Referring Physician: Valera Castle in Treatment: 0 Activities of Daily Living Items Answer Activities of Daily Living (Please select one for each item) Drive Automobile Completely Able Take Medications Completely Able Use Telephone Completely Able Care for Appearance Completely Able Use Toilet Completely Able Bath / Shower Completely Able Dress Self Completely Able Feed Self Completely Able Walk Completely Able Get In / Out Bed Completely Able Housework Completely  Able Prepare Meals Completely Able Handle Money Completely Able Shop for Self Completely Able Electronic Signature(s) Signed: 06/23/2016 4:39:21 PM By: Curtis Sites Entered By: Curtis Sites on 06/23/2016 08:16:15 Chiappetta, Trinna Balloon ([id number]) -------------------------------------------------------------------------------- Education Assessment Details Patient Name: Angelica Whitney. Date of Service: 06/23/2016 8:00 AM Medical Record Patient Account Number: [id number] Number: Treating RN: Curtis Sites 1940-10-11 (75 y.o. Other Clinician: Date of Birth/Sex: Female) Treating ROBSON, MICHAEL Primary Care Physician/Extender: Dixie Dials Physician: Referring Physician: Valera Castle in Treatment: 0 Primary Learner Assessed: Patient Learning Preferences/Education Level/Primary Language Learning Preference: Explanation, Demonstration Highest Education Level: High School Preferred Language: English Cognitive Barrier Assessment/Beliefs Language Barrier: No Translator Needed: No Memory Deficit: No Emotional Barrier: No Cultural/Religious Beliefs Affecting Medical No Care: Physical Barrier Assessment Impaired Vision: No Impaired Hearing: No Decreased Hand dexterity: No Knowledge/Comprehension Assessment Knowledge Level: Medium Comprehension Level: Medium Ability to understand written Medium instructions: Ability to understand verbal Medium instructions: Motivation Assessment Anxiety Level: Calm Cooperation: Cooperative Education Importance: Acknowledges Need Interest in Health Problems: Asks Questions Perception: Coherent Willingness to Engage in Self- Medium Management Activities: Angelica Whitney, Angelica Whitney ([id number]) Readiness to Engage in Self- Management Activities: Electronic Signature(s) Signed: 06/23/2016 4:39:21 PM By: Curtis Sites Entered By: Curtis Sites on 06/23/2016 08:16:36 Beitz, Trinna Balloon  ([id number]) -------------------------------------------------------------------------------- Fall Risk Assessment Details Patient Name: Angelica Whitney. Date of Service: 06/23/2016 8:00 AM Medical Record Patient Account Number: [id number] Number: Treating RN: Curtis Sites 24-Dec-1940 (75 y.o. Other Clinician: Date of Birth/Sex: Female) Treating ROBSON, MICHAEL Primary Care Physician/Extender: Dixie Dials Physician: Referring Physician: Valera Castle in Treatment: 0 Fall Risk Assessment Items Have you had 2 or more falls in the last 12 monthso 0 No Have you had any fall that resulted in injury in the last 12 monthso 0 No FALL RISK ASSESSMENT: History of falling - immediate or within 3 months 0 No Secondary diagnosis 0 No Ambulatory aid None/bed rest/wheelchair/nurse 0 Yes Crutches/cane/walker  0 No Furniture 0 No IV Access/Saline Lock 0 No Gait/Training Normal/bed rest/immobile 0 No Weak 10 Yes Impaired 0 No Mental Status Oriented to own ability 0 Yes Electronic Signature(s) Signed: 06/23/2016 4:39:21 PM By: Curtis Sites Entered By: Curtis Sites on 06/23/2016 08:16:52 Christon, Michaela K. ([id number]) -------------------------------------------------------------------------------- Foot Assessment Details Patient Name: Angelica Coil K. Date of Service: 06/23/2016 8:00 AM Medical Record Patient Account Number: [id number] Number: Treating RN: Curtis Sites 03/09/1941 (75 y.o. Other Clinician: Date of Birth/Sex: Female) Treating ROBSON, MICHAEL Primary Care Physician/Extender: Dixie Dials Physician: Referring Physician: Valera Castle in Treatment: 0 Foot Assessment Items Site Locations + = Sensation present, - = Sensation absent, C = Callus, U = Ulcer R = Redness, W = Warmth, M = Maceration, PU = Pre-ulcerative lesion F = Fissure, S = Swelling, D = Dryness Assessment Right: Left: Other Deformity: No No Prior  Foot Ulcer: No No Prior Amputation: No No Charcot Joint: No No Ambulatory Status: Ambulatory Without Help Gait: Steady Electronic Signature(s) Signed: 06/23/2016 4:39:21 PM By: Curtis Sites Entered By: Curtis Sites on 06/23/2016 08:17:10 Cavitt, Trinna Balloon ([id number]) Drab, Anyelin K. ([id number]) -------------------------------------------------------------------------------- Nutrition Risk Assessment Details Patient Name: Angelica Coil K. Date of Service: 06/23/2016 8:00 AM Medical Record Patient Account Number: [id number] Number: Treating RN: Curtis Sites [birth year] (75 y.o. Other Clinician: Date of Birth/Sex: Female) Treating ROBSON, MICHAEL Primary Care Physician/Extender: Dixie Dials Physician: Referring Physician: Valera Castle in Treatment: 0 Height (in): 59 Weight (lbs): 98 Body Mass Index (BMI): 19.8 Nutrition Risk Assessment Items NUTRITION RISK SCREEN: I have an illness or condition that made me change the kind and/or 0 No amount of food I eat I eat fewer than two meals per day 0 No I eat few fruits and vegetables, or milk products 0 No I have three or more drinks of beer, liquor or wine almost every day 0 No I have tooth or mouth problems that make it hard for me to eat 0 No I don't always have enough money to buy the food I need 0 No I eat alone most of the time 0 No I take three or more different prescribed or over-the-counter drugs a 1 Yes day Without wanting to, I have lost or gained 10 pounds in the last six 0 No months I am not always physically able to shop, cook and/or feed myself 0 No Nutrition Protocols Good Risk Protocol 0 No interventions needed Moderate Risk Protocol Electronic Signature(s) Signed: 06/23/2016 4:39:21 PM By: Curtis Sites Entered By: Curtis Sites on 06/23/2016 08:16:56

## 2016-07-02 ENCOUNTER — Ambulatory Visit: Admission: RE | Admit: 2016-07-02 | Payer: PPO | Source: Ambulatory Visit | Admitting: Podiatry

## 2016-07-02 ENCOUNTER — Encounter: Admission: RE | Payer: Self-pay | Source: Ambulatory Visit

## 2016-07-02 SURGERY — EXCISION, GANGLION CYST, FOOT
Anesthesia: Choice | Laterality: Right

## 2016-07-06 DIAGNOSIS — M25572 Pain in left ankle and joints of left foot: Secondary | ICD-10-CM | POA: Diagnosis not present

## 2016-07-21 DIAGNOSIS — Z8639 Personal history of other endocrine, nutritional and metabolic disease: Secondary | ICD-10-CM | POA: Diagnosis not present

## 2016-07-21 DIAGNOSIS — Z0000 Encounter for general adult medical examination without abnormal findings: Secondary | ICD-10-CM | POA: Diagnosis not present

## 2016-07-21 DIAGNOSIS — Z01818 Encounter for other preprocedural examination: Secondary | ICD-10-CM | POA: Diagnosis not present

## 2016-07-21 DIAGNOSIS — R7303 Prediabetes: Secondary | ICD-10-CM | POA: Diagnosis not present

## 2016-07-21 DIAGNOSIS — E7800 Pure hypercholesterolemia, unspecified: Secondary | ICD-10-CM | POA: Diagnosis not present

## 2016-07-22 ENCOUNTER — Ambulatory Visit
Admission: RE | Admit: 2016-07-22 | Discharge: 2016-07-22 | Disposition: A | Discharge status: Home / Self Care | Payer: PPO | Source: Ambulatory Visit | Attending: Family Medicine | Admitting: Family Medicine

## 2016-07-22 DIAGNOSIS — R928 Other abnormal and inconclusive findings on diagnostic imaging of breast: Secondary | ICD-10-CM | POA: Diagnosis not present

## 2016-07-22 DIAGNOSIS — Z1231 Encounter for screening mammogram for malignant neoplasm of breast: Secondary | ICD-10-CM | POA: Diagnosis not present

## 2016-07-23 ENCOUNTER — Other Ambulatory Visit: Payer: Self-pay | Admitting: Family Medicine

## 2016-07-23 DIAGNOSIS — N6489 Other specified disorders of breast: Secondary | ICD-10-CM

## 2016-07-23 DIAGNOSIS — R928 Other abnormal and inconclusive findings on diagnostic imaging of breast: Secondary | ICD-10-CM

## 2016-07-27 ENCOUNTER — Inpatient Hospital Stay: Admission: RE | Admit: 2016-07-27 | Payer: PPO | Source: Ambulatory Visit

## 2016-07-27 ENCOUNTER — Encounter
Admission: RE | Admit: 2016-07-27 | Discharge: 2016-07-27 | Disposition: A | Discharge status: Home / Self Care | Payer: PPO | Source: Ambulatory Visit | Attending: Podiatry | Admitting: Podiatry

## 2016-07-27 DIAGNOSIS — Z01812 Encounter for preprocedural laboratory examination: Secondary | ICD-10-CM | POA: Insufficient documentation

## 2016-07-27 DIAGNOSIS — Z01810 Encounter for preprocedural cardiovascular examination: Secondary | ICD-10-CM | POA: Diagnosis not present

## 2016-07-27 DIAGNOSIS — I091 Rheumatic diseases of endocardium, valve unspecified: Secondary | ICD-10-CM | POA: Diagnosis not present

## 2016-07-27 DIAGNOSIS — M67479 Ganglion, unspecified ankle and foot: Secondary | ICD-10-CM | POA: Insufficient documentation

## 2016-07-27 DIAGNOSIS — L97511 Non-pressure chronic ulcer of other part of right foot limited to breakdown of skin: Secondary | ICD-10-CM | POA: Diagnosis not present

## 2016-07-27 HISTORY — DX: Rheumatic aortic insufficiency: I06.1

## 2016-07-27 LAB — CBC
HCT: 35.2 % (ref 35.0–47.0)
Hemoglobin: 11.8 g/dL — ABNORMAL LOW (ref 12.0–16.0)
MCH: 33.8 pg (ref 26.0–34.0)
MCHC: 33.5 g/dL (ref 32.0–36.0)
MCV: 101 fL — ABNORMAL HIGH (ref 80.0–100.0)
Platelets: 195 10*3/uL (ref 150–440)
RBC: 3.48 MIL/uL — AB (ref 3.80–5.20)
RDW: 13.4 % (ref 11.5–14.5)
WBC: 4.1 10*3/uL (ref 3.6–11.0)

## 2016-07-27 LAB — DIFFERENTIAL
BASOS PCT: 2 %
Basophils Absolute: 0.1 10*3/uL (ref 0–0.1)
Eosinophils Absolute: 0.1 10*3/uL (ref 0–0.7)
Eosinophils Relative: 3 %
LYMPHS PCT: 24 %
Lymphs Abs: 1 10*3/uL (ref 1.0–3.6)
Monocytes Absolute: 0.3 10*3/uL (ref 0.2–0.9)
Monocytes Relative: 8 %
NEUTROS ABS: 2.6 10*3/uL (ref 1.4–6.5)
NEUTROS PCT: 63 %

## 2016-07-27 NOTE — Patient Instructions (Signed)
  Your procedure is scheduled JJ:OACZYS 08/06/16 Report to Day Surgery. To find out your arrival time please call [phone] between 1PM - 3PM on Thurs. 08/05/16.  Remember: Instructions that are not followed completely may result in serious medical risk, up to and including death, or upon the discretion of your surgeon and anesthesiologist your surgery may need to be rescheduled.    __x__ 1. Do not eat food or drink liquids after midnight. No gum chewing or hard candies.     __x__ 2. No Alcohol for 24 hours before or after surgery.   ____ 3. Do Not Smoke For 24 Hours Prior to Your Surgery.   ____ 4. Bring all medications with you on the day of surgery if instructed.    __x__ 5. Notify your doctor if there is any change in your medical condition     (cold, fever, infections).       Do not wear jewelry, make-up, hairpins, clips or nail polish.  Do not wear lotions, powders, or perfumes. You may wear deodorant.  Do not shave 48 hours prior to surgery. Men may shave face and neck.  Do not bring valuables to the hospital.    [facility] is not responsible for any belongings or valuables.               Contacts, dentures or bridgework may not be worn into surgery.  Leave your suitcase in the car. After surgery it may be brought to your room.  For patients admitted to the hospital, discharge time is determined by your                treatment team.   Patients discharged the day of surgery will not be allowed to drive home.   Please read over the following fact sheets that you were given:      ____ Take these medicines the morning of surgery with A SIP OF WATER:    1.  2.  3.   4.  5.  6.  ____ Fleet Enema (as directed)   __x__ Use CHG Soap as directed  ____ Use inhalers on the day of surgery  ____ Stop metformin 2 days prior to surgery    ____ Take 1/2 of usual insulin dose the night before surgery and none on the morning of surgery.   __x__ Stop aspirin on ( Already  stopped)  __x__ Stop Anti-inflammatories on ibuprofen (ADVIL,MOTRIN) 200 MG tablet,    __x__ Stop supplements until after surgery. Glucosamine HCl (GLUCOSAMINE PO)    ____ Bring C-Pap to the hospital.

## 2016-07-30 ENCOUNTER — Ambulatory Visit: Admission: RE | Admit: 2016-07-30 | Payer: PPO | Source: Ambulatory Visit | Admitting: Podiatry

## 2016-07-30 ENCOUNTER — Encounter: Admission: RE | Payer: Self-pay | Source: Ambulatory Visit

## 2016-07-30 SURGERY — EXCISION, GANGLION CYST, FOOT
Anesthesia: Choice | Laterality: Right

## 2016-08-06 ENCOUNTER — Ambulatory Visit: Payer: PPO | Admitting: Certified Registered Nurse Anesthetist

## 2016-08-06 ENCOUNTER — Encounter: Payer: Self-pay | Admitting: *Deleted

## 2016-08-06 ENCOUNTER — Encounter
Admission: RE | Disposition: A | Discharge status: Home / Self Care | Payer: Self-pay | Source: Ambulatory Visit | Attending: Podiatry

## 2016-08-06 ENCOUNTER — Ambulatory Visit
Admission: RE | Admit: 2016-08-06 | Discharge: 2016-08-06 | Disposition: A | Discharge status: Home / Self Care | Payer: PPO | Source: Ambulatory Visit | Attending: Podiatry | Admitting: Podiatry

## 2016-08-06 DIAGNOSIS — I351 Nonrheumatic aortic (valve) insufficiency: Secondary | ICD-10-CM | POA: Diagnosis not present

## 2016-08-06 DIAGNOSIS — Z881 Allergy status to other antibiotic agents status: Secondary | ICD-10-CM | POA: Diagnosis not present

## 2016-08-06 DIAGNOSIS — Z87891 Personal history of nicotine dependence: Secondary | ICD-10-CM | POA: Insufficient documentation

## 2016-08-06 DIAGNOSIS — Z888 Allergy status to other drugs, medicaments and biological substances status: Secondary | ICD-10-CM | POA: Insufficient documentation

## 2016-08-06 DIAGNOSIS — Z885 Allergy status to narcotic agent status: Secondary | ICD-10-CM | POA: Insufficient documentation

## 2016-08-06 DIAGNOSIS — R011 Cardiac murmur, unspecified: Secondary | ICD-10-CM | POA: Diagnosis not present

## 2016-08-06 DIAGNOSIS — M67471 Ganglion, right ankle and foot: Secondary | ICD-10-CM | POA: Diagnosis not present

## 2016-08-06 DIAGNOSIS — Z7982 Long term (current) use of aspirin: Secondary | ICD-10-CM | POA: Diagnosis not present

## 2016-08-06 DIAGNOSIS — M06371 Rheumatoid nodule, right ankle and foot: Secondary | ICD-10-CM | POA: Diagnosis not present

## 2016-08-06 DIAGNOSIS — M67479 Ganglion, unspecified ankle and foot: Secondary | ICD-10-CM | POA: Diagnosis not present

## 2016-08-06 DIAGNOSIS — M06871 Other specified rheumatoid arthritis, right ankle and foot: Secondary | ICD-10-CM | POA: Diagnosis not present

## 2016-08-06 HISTORY — PX: GANGLION CYST EXCISION: SHX1691

## 2016-08-06 SURGERY — EXCISION, GANGLION CYST, FOOT
Anesthesia: General | Laterality: Right

## 2016-08-06 MED ORDER — DEXAMETHASONE SODIUM PHOSPHATE 10 MG/ML IJ SOLN
INTRAMUSCULAR | Status: AC
  Filled 2016-08-06: qty 1

## 2016-08-06 MED ORDER — CEFAZOLIN SODIUM-DEXTROSE 2-4 GM/100ML-% IV SOLN
INTRAVENOUS | Status: AC
  Filled 2016-08-06: qty 100

## 2016-08-06 MED ORDER — LACTATED RINGERS IV SOLN
INTRAVENOUS | Status: DC
  Administered 2016-08-06: 12:00:00 via INTRAVENOUS

## 2016-08-06 MED ORDER — BUPIVACAINE HCL (PF) 0.5 % IJ SOLN
INTRAMUSCULAR | Status: DC | PRN
  Administered 2016-08-06: 10 mL

## 2016-08-06 MED ORDER — FENTANYL CITRATE (PF) 100 MCG/2ML IJ SOLN
INTRAMUSCULAR | Status: DC | PRN
  Administered 2016-08-06: 25 ug via INTRAVENOUS

## 2016-08-06 MED ORDER — ONDANSETRON HCL 4 MG/2ML IJ SOLN: 4.0000 mg | Freq: Once | INTRAMUSCULAR | Status: DC | PRN

## 2016-08-06 MED ORDER — MIDAZOLAM HCL 2 MG/2ML IJ SOLN
INTRAMUSCULAR | Status: DC | PRN
  Administered 2016-08-06: 1 mg via INTRAVENOUS

## 2016-08-06 MED ORDER — FAMOTIDINE 20 MG PO TABS
20.0000 mg | ORAL_TABLET | Freq: Once | ORAL | Status: AC
  Administered 2016-08-06: 20 mg via ORAL

## 2016-08-06 MED ORDER — DEXAMETHASONE SODIUM PHOSPHATE 10 MG/ML IJ SOLN
INTRAMUSCULAR | Status: DC | PRN
  Administered 2016-08-06: 4 mg via INTRAVENOUS

## 2016-08-06 MED ORDER — MIDAZOLAM HCL 2 MG/2ML IJ SOLN
INTRAMUSCULAR | Status: AC
  Filled 2016-08-06: qty 2

## 2016-08-06 MED ORDER — FAMOTIDINE 20 MG PO TABS
ORAL_TABLET | ORAL | Status: AC
  Administered 2016-08-06: 20 mg via ORAL
  Filled 2016-08-06: qty 1

## 2016-08-06 MED ORDER — HYDROCODONE-ACETAMINOPHEN 5-325 MG PO TABS: 1.0000 | ORAL_TABLET | ORAL | 0 refills | Status: AC | PRN

## 2016-08-06 MED ORDER — BUPIVACAINE HCL (PF) 0.5 % IJ SOLN
INTRAMUSCULAR | Status: AC
  Filled 2016-08-06: qty 30

## 2016-08-06 MED ORDER — PROPOFOL 10 MG/ML IV BOLUS
INTRAVENOUS | Status: DC | PRN
  Administered 2016-08-06: 40 mg via INTRAVENOUS

## 2016-08-06 MED ORDER — GLYCOPYRROLATE 0.2 MG/ML IJ SOLN
INTRAMUSCULAR | Status: AC
  Filled 2016-08-06: qty 1

## 2016-08-06 MED ORDER — PROPOFOL 500 MG/50ML IV EMUL
INTRAVENOUS | Status: AC
  Filled 2016-08-06: qty 50

## 2016-08-06 MED ORDER — FENTANYL CITRATE (PF) 100 MCG/2ML IJ SOLN
INTRAMUSCULAR | Status: AC
  Filled 2016-08-06: qty 2

## 2016-08-06 MED ORDER — PHENYLEPHRINE HCL 10 MG/ML IJ SOLN
INTRAMUSCULAR | Status: DC | PRN
  Administered 2016-08-06 (×2): 100 ug via INTRAVENOUS

## 2016-08-06 MED ORDER — GLYCOPYRROLATE 0.2 MG/ML IJ SOLN
INTRAMUSCULAR | Status: DC | PRN
  Administered 2016-08-06: 0.2 mg via INTRAVENOUS

## 2016-08-06 MED ORDER — LIDOCAINE HCL (PF) 2 % IJ SOLN
INTRAMUSCULAR | Status: DC | PRN
  Administered 2016-08-06: 20 mg via INTRADERMAL

## 2016-08-06 MED ORDER — PROPOFOL 500 MG/50ML IV EMUL
INTRAVENOUS | Status: DC | PRN
  Administered 2016-08-06: 50 ug/kg/min via INTRAVENOUS

## 2016-08-06 MED ORDER — ONDANSETRON HCL 4 MG/2ML IJ SOLN
INTRAMUSCULAR | Status: AC
  Filled 2016-08-06: qty 2

## 2016-08-06 MED ORDER — FENTANYL CITRATE (PF) 100 MCG/2ML IJ SOLN: 25.0000 ug | INTRAMUSCULAR | Status: DC | PRN

## 2016-08-06 MED ORDER — CEFAZOLIN SODIUM-DEXTROSE 2-4 GM/100ML-% IV SOLN
2.0000 g | Freq: Once | INTRAVENOUS | Status: AC
  Administered 2016-08-06: 2 g via INTRAVENOUS

## 2016-08-06 MED ORDER — PROPOFOL 10 MG/ML IV BOLUS
INTRAVENOUS | Status: AC
  Filled 2016-08-06: qty 20

## 2016-08-06 SURGICAL SUPPLY — 30 items
BANDAGE ELASTIC 4 LF NS (GAUZE/BANDAGES/DRESSINGS) ×3 IMPLANT
BANDAGE STRETCH 3X4.1 STRL (GAUZE/BANDAGES/DRESSINGS) ×3 IMPLANT
BNDG ESMARK 4X12 TAN STRL LF (GAUZE/BANDAGES/DRESSINGS) ×3 IMPLANT
CANISTER SUCT 1200ML W/VALVE (MISCELLANEOUS) ×3 IMPLANT
CLOSURE WOUND 1/4X4 (GAUZE/BANDAGES/DRESSINGS) ×1
CUFF TOURN 18 STER (MISCELLANEOUS) IMPLANT
CUFF TOURN DUAL PL 12 NO SLV (MISCELLANEOUS) ×3 IMPLANT
DURAPREP 26ML APPLICATOR (WOUND CARE) ×3 IMPLANT
ELECT REM PT RETURN 9FT ADLT (ELECTROSURGICAL) ×3
ELECTRODE REM PT RTRN 9FT ADLT (ELECTROSURGICAL) ×1 IMPLANT
GAUZE PETRO XEROFOAM 1X8 (MISCELLANEOUS) ×3 IMPLANT
GAUZE SPONGE 4X4 12PLY STRL (GAUZE/BANDAGES/DRESSINGS) ×3 IMPLANT
GLOVE BIO SURGEON STRL SZ7.5 (GLOVE) ×3 IMPLANT
GLOVE INDICATOR 8.0 STRL GRN (GLOVE) ×3 IMPLANT
GOWN STRL REUS W/ TWL LRG LVL3 (GOWN DISPOSABLE) ×1 IMPLANT
GOWN STRL REUS W/TWL LRG LVL3 (GOWN DISPOSABLE) ×2
KIT RM TURNOVER STRD PROC AR (KITS) ×3 IMPLANT
LABEL OR SOLS (LABEL) IMPLANT
NDL SAFETY 25GX1.5 (NEEDLE) ×3 IMPLANT
NS IRRIG 500ML POUR BTL (IV SOLUTION) ×3 IMPLANT
PACK EXTREMITY ARMC (MISCELLANEOUS) ×3 IMPLANT
PENCIL ELECTRO HAND CTR (MISCELLANEOUS) ×3 IMPLANT
SOL PREP PVP 2OZ (MISCELLANEOUS) ×3
SOLUTION PREP PVP 2OZ (MISCELLANEOUS) ×1 IMPLANT
STOCKINETTE STRL 6IN 960660 (GAUZE/BANDAGES/DRESSINGS) ×3 IMPLANT
STRIP CLOSURE SKIN 1/4X4 (GAUZE/BANDAGES/DRESSINGS) ×2 IMPLANT
SUT ETHILON 5-0 FS-2 18 BLK (SUTURE) IMPLANT
SUT VIC AB 4-0 FS2 27 (SUTURE) ×3 IMPLANT
SWABSTK COMLB BENZOIN TINCTURE (MISCELLANEOUS) ×3 IMPLANT
SYRINGE 10CC LL (SYRINGE) ×3 IMPLANT

## 2016-08-06 NOTE — Discharge Instructions (Addendum)
1. Elevate right foot.  2. Keep the right foot clean, dry, and do not remove the bandage.  3. Sponge bathe only right lower extremity.  4. Wear surgical shoe on the right foot whenever walking or standing.  5. Take one pain pill, Norco, every 4 hours if needed for pain.   AMBULATORY SURGERY  DISCHARGE INSTRUCTIONS   1) The drugs that you were given will stay in your system until tomorrow so for the next 24 hours you should not:  A) Drive an automobile B) Make any legal decisions C) Drink any alcoholic beverage   2) You may resume regular meals tomorrow.  Today it is better to start with liquids and gradually work up to solid foods.  You may eat anything you prefer, but it is better to start with liquids, then soup and crackers, and gradually work up to solid foods.   3) Please notify your doctor immediately if you have any unusual bleeding, trouble breathing, redness and pain at the surgery site, drainage, fever, or pain not relieved by medication.    4) Additional Instructions:   Please contact your physician with any problems or Same Day Surgery at [phone], Monday through Friday 6 am to 4 pm, or [place] at [facility] number at [phone].

## 2016-08-06 NOTE — Interval H&P Note (Signed)
History and Physical Interval Note:  08/06/2016 10:10 AM  Angelica Whitney  has presented today for surgery, with the diagnosis of Ganglion cyst M67.479  The various methods of treatment have been discussed with the patient and family. After consideration of risks, benefits and other options for treatment, the patient has consented to  Procedure(s): REMOVAL GANGLION CYST FOOT (Right) as a surgical intervention .  The patient's history has been reviewed, patient examined, no change in status, stable for surgery.  I have reviewed the patient's chart and labs.  Questions were answered to the patient's satisfaction.     Ricci Barker

## 2016-08-06 NOTE — Anesthesia Post-op Follow-up Note (Cosign Needed)
Anesthesia QCDR form completed.        

## 2016-08-06 NOTE — Op Note (Signed)
Date of operation: 08/06/2016.  Surgeon: Ricci Barker DPM.  Preoperative diagnosis: Soft tissue cyst right first metatarsal.  Postoperative diagnosis: Same putting pathology.  Procedure: Excision soft tissue cyst right foot.  Anesthesia: Local with sedation.  Hemostasis: Pneumatic tourniquet right ankle 250 mmHg.  Estimated blood loss: Less than 5 cc.  Pathology: Soft tissue tumor right foot.  Complications: None apparent.  Operative indications: This is a 76 year old female with chronic problems from a cyst with recurrent ulcerations on her right foot. The ulcer has become stable and patient elects for surgical excision of the cystic area.  Operative procedure: Patient was taken to the operating room and placed on the table in the supine position. Lolling satisfactory sedation the right foot was anesthetized with 10 cc of 0.5% bupivacaine plain around the first metatarsal. A pneumatic tourniquet was applied at the level of the right ankle and the foot was prepped and draped in the usual sterile fashion. The right foot was exsanguinated and the tourniquet was inflated to 250 mmHg. Attention was then directed to the medial aspect of the right foot where an approximate 2.5 cm slightly elliptical incision was placed over the cystic area along the medial aspect of the first metatarsal. The central skin wedge was removed. The incision was deepened via sharp and blunt dissection around the cystic area on the medial aspect of the first metatarsal head. The lesion was then removed in toto. A small area of joint capsule was incised upon removal of the lesion. The wound was then flushed with copious amounts of sterile saline and the capsular tear was repaired using a 4-0 Vicryl simple interrupted suture followed by skin closure using a 4-0 Vicryl running suture for all layers from deep and superficial subcutaneous to skin closure. Tincture of benzoin and Steri-Strips applied followed by Xeroform and a  sterile gauze bandage. The tourniquet was released and blood flow noted to return immediately to the right foot and all digits. Ace wrap was then applied for compression. Patient tolerated the procedure and anesthesia well and was transported to the PACU with vital signs stable and in good condition

## 2016-08-06 NOTE — Anesthesia Preprocedure Evaluation (Signed)
Anesthesia Evaluation  Patient identified by MRN, date of birth, ID band Patient awake    Reviewed: Allergy & Precautions, NPO status , Patient's Chart, lab work & pertinent test results  Airway Mallampati: II  TM Distance: >3 FB     Dental  (+) Upper Dentures, Missing, Chipped   Pulmonary former smoker,    Pulmonary exam normal        Cardiovascular Normal cardiovascular exam+ Valvular Problems/Murmurs AI      Neuro/Psych negative neurological ROS  negative psych ROS   GI/Hepatic negative GI ROS, Neg liver ROS,   Endo/Other  negative endocrine ROS  Renal/GU negative Renal ROS  negative genitourinary   Musculoskeletal negative musculoskeletal ROS (+)   Abdominal Normal abdominal exam  (+)   Peds negative pediatric ROS (+)  Hematology negative hematology ROS (+)   Anesthesia Other Findings   Reproductive/Obstetrics                             Anesthesia Physical Anesthesia Plan  ASA: III  Anesthesia Plan: General   Post-op Pain Management:    Induction: Intravenous  Airway Management Planned:   Additional Equipment:   Intra-op Plan:   Post-operative Plan:   Informed Consent: I have reviewed the patients History and Physical, chart, labs and discussed the procedure including the risks, benefits and alternatives for the proposed anesthesia with the patient or authorized representative who has indicated his/her understanding and acceptance.   Dental advisory given  Plan Discussed with: CRNA and Surgeon  Anesthesia Plan Comments:         Anesthesia Quick Evaluation

## 2016-08-06 NOTE — Transfer of Care (Signed)
Immediate Anesthesia Transfer of Care Note  Patient: Angelica Whitney  Procedure(s) Performed: Procedure(s): REMOVAL GANGLION CYST FOOT (Right)  Patient Location: PACU  Anesthesia Type:MAC  Level of Consciousness: awake, alert  and responds to stimulation  Airway & Oxygen Therapy: Patient Spontanous Breathing and Patient connected to nasal cannula oxygen  Post-op Assessment: Report given to RN and Post -op Vital signs reviewed and stable  Post vital signs: Reviewed and stable  Last Vitals:  Vitals:   08/06/16 0935 08/06/16 1235  BP: 108/61 128/71  Pulse: 78 85  Resp: 16 (!) 28  Temp: 36.7 C     Last Pain:  Vitals:   08/06/16 0935  TempSrc: Tympanic  PainSc: 0-No pain         Complications: No apparent anesthesia complications

## 2016-08-06 NOTE — Anesthesia Postprocedure Evaluation (Signed)
Anesthesia Post Note  Patient: Angelica Whitney  Procedure(s) Performed: Procedure(s) (LRB): REMOVAL GANGLION CYST FOOT (Right)  Patient location during evaluation: PACU Anesthesia Type: General Level of consciousness: awake and alert and oriented Pain management: pain level controlled Vital Signs Assessment: post-procedure vital signs reviewed and stable Respiratory status: spontaneous breathing Cardiovascular status: blood pressure returned to baseline Anesthetic complications: no     Last Vitals:  Vitals:   08/06/16 1333 08/06/16 1419  BP: 131/63 (!) 128/45  Pulse: 80 77  Resp: 16 16  Temp: 36.6 C     Last Pain:  Vitals:   08/06/16 1333  TempSrc: Oral  PainSc:                  Angelica Whitney

## 2016-08-06 NOTE — H&P (Signed)
Medical History and physical in the chart was reviewed. No interval changes. Patient stable for surgery

## 2016-08-10 LAB — SURGICAL PATHOLOGY

## 2016-08-18 ENCOUNTER — Other Ambulatory Visit: Payer: PPO

## 2016-08-18 ENCOUNTER — Ambulatory Visit: Payer: PPO

## 2016-08-24 ENCOUNTER — Ambulatory Visit
Admission: RE | Admit: 2016-08-24 | Discharge: 2016-08-24 | Disposition: A | Discharge status: Home / Self Care | Payer: PPO | Source: Ambulatory Visit | Attending: Family Medicine | Admitting: Family Medicine

## 2016-08-24 DIAGNOSIS — N6489 Other specified disorders of breast: Secondary | ICD-10-CM

## 2016-08-24 DIAGNOSIS — R928 Other abnormal and inconclusive findings on diagnostic imaging of breast: Secondary | ICD-10-CM

## 2016-12-03 DIAGNOSIS — K219 Gastro-esophageal reflux disease without esophagitis: Secondary | ICD-10-CM | POA: Diagnosis not present

## 2016-12-03 DIAGNOSIS — D708 Other neutropenia: Secondary | ICD-10-CM | POA: Diagnosis not present

## 2016-12-03 DIAGNOSIS — Z8371 Family history of colonic polyps: Secondary | ICD-10-CM | POA: Diagnosis not present

## 2016-12-14 ENCOUNTER — Emergency Department: Payer: PPO

## 2016-12-14 ENCOUNTER — Emergency Department
Admission: EM | Admit: 2016-12-14 | Discharge: 2016-12-14 | Disposition: A | Discharge status: Home / Self Care | Payer: PPO | Attending: Emergency Medicine | Admitting: Emergency Medicine

## 2016-12-14 ENCOUNTER — Encounter: Payer: Self-pay | Admitting: Emergency Medicine

## 2016-12-14 DIAGNOSIS — Z87891 Personal history of nicotine dependence: Secondary | ICD-10-CM | POA: Diagnosis not present

## 2016-12-14 DIAGNOSIS — S00531A Contusion of lip, initial encounter: Secondary | ICD-10-CM | POA: Insufficient documentation

## 2016-12-14 DIAGNOSIS — S99912A Unspecified injury of left ankle, initial encounter: Secondary | ICD-10-CM | POA: Diagnosis not present

## 2016-12-14 DIAGNOSIS — W010XXA Fall on same level from slipping, tripping and stumbling without subsequent striking against object, initial encounter: Secondary | ICD-10-CM | POA: Insufficient documentation

## 2016-12-14 DIAGNOSIS — S99911A Unspecified injury of right ankle, initial encounter: Secondary | ICD-10-CM | POA: Diagnosis not present

## 2016-12-14 DIAGNOSIS — Y939 Activity, unspecified: Secondary | ICD-10-CM | POA: Diagnosis not present

## 2016-12-14 DIAGNOSIS — Y999 Unspecified external cause status: Secondary | ICD-10-CM | POA: Diagnosis not present

## 2016-12-14 DIAGNOSIS — S93402A Sprain of unspecified ligament of left ankle, initial encounter: Secondary | ICD-10-CM | POA: Insufficient documentation

## 2016-12-14 DIAGNOSIS — M25572 Pain in left ankle and joints of left foot: Secondary | ICD-10-CM | POA: Diagnosis not present

## 2016-12-14 DIAGNOSIS — S0993XA Unspecified injury of face, initial encounter: Secondary | ICD-10-CM | POA: Diagnosis not present

## 2016-12-14 DIAGNOSIS — Y929 Unspecified place or not applicable: Secondary | ICD-10-CM | POA: Diagnosis not present

## 2016-12-14 DIAGNOSIS — R220 Localized swelling, mass and lump, head: Secondary | ICD-10-CM | POA: Diagnosis not present

## 2016-12-14 MED ORDER — ACETAMINOPHEN 325 MG PO TABS
650.0000 mg | ORAL_TABLET | Freq: Once | ORAL | Status: AC
  Administered 2016-12-14: 650 mg via ORAL
  Filled 2016-12-14: qty 2

## 2016-12-14 MED ORDER — TRAMADOL HCL 50 MG PO TABS: 50.0000 mg | ORAL_TABLET | Freq: Two times a day (BID) | ORAL | 0 refills | Status: AC | PRN

## 2016-12-14 NOTE — ED Triage Notes (Addendum)
Fell today.  States tripped over the threshold while hands were full.   C/O left ankle pain.   Also left upper lip swelling.

## 2016-12-14 NOTE — ED Notes (Signed)
See triage note  States she tripped twisted left ankle

## 2016-12-14 NOTE — ED Triage Notes (Signed)
Pt here from [facility] after fall, hematoma to top lip.

## 2016-12-14 NOTE — ED Notes (Signed)
See triage note  States she tripped  Twisted left ankle  Also hit her face  Swelling noted to upper lip  Min bleeding noted

## 2016-12-14 NOTE — ED Provider Notes (Signed)
[facility] Emergency Department Provider Note  ____________________________________________  Time seen: Approximately 6:15 PM  I have reviewed the triage vital signs and the nursing notes.   HISTORY  Chief Complaint Ankle Injury    HPI Angelica Whitney is a 76 y.o. female presenting to the emergency department with left upper lip swelling and ecchymosis after tripping and falling on the concrete. Patient also complains of 10 out of 10 upper jaw tenderness and left ankle pain. Patient reports no new changes in vision, nausea, vomiting, abdominal pain or confusion. Patient states that she drove herself to the emergency department without difficulty. Patient has had no difficulty swallowing and is speaking in complete sentences. No alleviating measures have been attempted.    Past Medical History:  Diagnosis Date  . Rheumatic aortic insufficiency    rheumatic fever as a child    There are no active problems to display for this patient.   Past Surgical History:  Procedure Laterality Date  . ABDOMINAL HYSTERECTOMY    . BREAST BIOPSY Left 2007   benign  . GANGLION CYST EXCISION Right 08/06/2016   Procedure: REMOVAL GANGLION CYST FOOT;  Surgeon: Linus Galas, DPM;  Location: [facility] ORS;  Service: Podiatry;  Laterality: Right;    Prior to Admission medications   Medication Sig Start Date End Date Taking? Authorizing Provider  aspirin 81 MG chewable tablet Chew 81 mg by mouth daily.    [provider]  Calcium Carbonate-Vitamin D 600-400 MG-UNIT tablet Take 2 tablets by mouth daily.    [provider]  Glucosamine HCl (GLUCOSAMINE PO) Take 1 tablet by mouth daily.    [provider]  HYDROcodone-acetaminophen (NORCO) 5-325 MG tablet Take 1 tablet by mouth every 4 (four) hours as needed for moderate pain. 08/06/16   Linus Galas, DPM  ibuprofen (ADVIL,MOTRIN) 200 MG tablet Take 400 mg by mouth every 6 (six) hours as needed for mild pain.     [provider]  Liniments (SALONPAS PAIN RELIEF PATCH EX) Apply 1 patch topically daily as needed.    [provider]  OVER THE COUNTER MEDICATION Take 1 tablet by mouth daily. Cholesterol Health Supplement    [provider]  predniSONE (DELTASONE) 5 MG tablet Take 2.5 mg by mouth daily. 05/19/16   [provider]  traMADol (ULTRAM) 50 MG tablet Take 1 tablet (50 mg total) by mouth every 12 (twelve) hours as needed. 12/14/16 12/19/16  Orvil Feil, PA-C    Allergies Caffeine; Levofloxacin; and Codeine  Family History  Problem Relation Age of Onset  . Breast cancer Sister 85  . Breast cancer Maternal Aunt 76    Social History Social History  Substance Use Topics  . Smoking status: Former Smoker    Quit date: 07/27/1985  . Smokeless tobacco: Never Used  . Alcohol use No     Review of Systems  Constitutional: No fever/chills Eyes: No visual changes. No discharge ENT: Patient has upper lip swelling and ecchymosis. Patient has upper jaw tenderness. Cardiovascular: no chest pain. Respiratory: no cough. No SOB. Gastrointestinal: No abdominal pain.  No nausea, no vomiting.  No diarrhea.  No constipation. Musculoskeletal: Patient has left ankle pain. Skin: Patient has small abrasion of upper lip. Neurological: Negative for headaches, focal weakness or numbness.   ____________________________________________   PHYSICAL EXAM:  VITAL SIGNS: ED Triage Vitals  Enc Vitals Group     BP 12/14/16 1729 (!) 174/96     Pulse Rate 12/14/16 1729 78  Resp 12/14/16 1729 16     Temp 12/14/16 1729 98.5 F (36.9 C)     Temp Source 12/14/16 1729 Oral     SpO2 12/14/16 1729 97 %     Weight 12/14/16 1725 100 lb (45.4 kg)     Height 12/14/16 1725 4\' 10"  (1.473 m)     Head Circumference --      Peak Flow --      Pain Score 12/14/16 1724 10     Pain Loc --      Pain Edu? --      Excl. in GC? --      Constitutional: Alert and oriented. Well appearing  and in no acute distress. Eyes: Conjunctivae are normal. PERRL. EOMI. Head: Atraumatic. ENT:      Ears: Tympanic membranes are pearly bilaterally.      Nose: No congestion/rhinnorhea.      Mouth/Throat: She has upper lip swelling and ecchymosis. Neck: Full range of motion. No pain was elicited with palpation along the cervical spine. Cardiovascular: Normal rate, regular rhythm. Normal S1 and S2.  Good peripheral circulation. Respiratory: Normal respiratory effort without tachypnea or retractions. Lungs CTAB. Good air entry to the bases with no decreased or absent breath sounds. Musculoskeletal: Patient has 5 out of 5 strength in the lower extremities bilaterally. Patient can perform dorsiflexion and plantar flexion at the left ankle. Patient is able to move all 5 left toes. Palpable dorsalis pedis pulse bilaterally and symmetrically. She has tenderness of the upper jaw. Neurologic:  Normal speech and language. No gross focal neurologic deficits are appreciated.  Skin:  She has small abrasion of the upper lip. No rash noted. Psychiatric: Mood and affect are normal. Speech and behavior are normal. Patient exhibits appropriate insight and judgement.   ____________________________________________   LABS (all labs ordered are listed, but only abnormal results are displayed)  Labs Reviewed - No data to display ____________________________________________  EKG   ____________________________________________  RADIOLOGY Geraldo Pitter, personally viewed and evaluated these images as part of my medical decision making, as well as reviewing the written report by the radiologist.  Dg Ankle Complete Left  Result Date: 12/14/2016 CLINICAL DATA:  Larey Seat on concrete floor.  Diffuse pain. EXAM: LEFT ANKLE COMPLETE - 3+ VIEW COMPARISON:  11/12/2013 FINDINGS: There is no evidence of fracture, dislocation, or joint effusion. There is no evidence of arthropathy or other focal bone abnormality. Soft  tissues are unremarkable. IMPRESSION: Negative. Electronically Signed   By: Paulina Fusi M.D.   On: 12/14/2016 18:00   Ct Maxillofacial Wo Contrast  Result Date: 12/14/2016 CLINICAL DATA:  Initial evaluation for acute trauma, fall. EXAM: CT MAXILLOFACIAL WITHOUT CONTRAST TECHNIQUE: Multidetector CT imaging of the maxillofacial structures was performed. Multiplanar CT image reconstructions were also generated. A small metallic BB was placed on the right temple in order to reliably differentiate right from left. COMPARISON:  None. FINDINGS: Osseous: Zygomatic arches intact. No acute maxillary fracture. Pterygoid plates intact. Nasal bones intact. No acute mandibular fracture. Mandibular condyles normally situated. No acute abnormality about the remaining dentition. Orbits: Globes intact. Intraorbital soft tissues within normal limits. Bony orbits intact. Sinuses: Clear. Soft tissues: Soft tissue swelling present at the upper lip, which may reflect sequelae of trauma/contusion. No other appreciable soft tissue injury about the face. Punctate calcifications noted within the parotid glands bilaterally. Limited intracranial: Unremarkable. IMPRESSION: 1. Soft tissue swelling at the upper lip, which may reflect sequelae of trauma/contusion. 2. No other acute maxillofacial injury.  No fracture. Electronically Signed   By: Rise Mu M.D.   On: 12/14/2016 18:51      ____________________________________________    PROCEDURES  Procedure(s) performed:    Procedures    Medications  acetaminophen (TYLENOL) tablet 650 mg (650 mg Oral Given 12/14/16 1841)     ____________________________________________   INITIAL IMPRESSION / ASSESSMENT AND PLAN / ED COURSE  Pertinent labs & imaging results that were available during my care of the patient were reviewed by me and considered in my medical decision making (see chart for details).  Review of the [place] CSRS was performed in accordance of the [facility]  prior to dispensing any controlled drugs.     Assessment and plan: Fall: Patient presents to the emergency department with upper jaw tenderness and significant upper lip swelling with ecchymosis. CT maxillofacial indicates soft tissue swelling of the upper lip but no acute fractures. Patient also reports left ankle pain. DG left ankle reveals no acute fractures or bony abnormalities. An Ace wrap was applied in the emergency department and Tylenol was provided for pain. Patient was discharged with tramadol. Vital signs were reassuring prior to discharge. All patient questions were answered.     ____________________________________________  FINAL CLINICAL IMPRESSION(S) / ED DIAGNOSES  Final diagnoses:  Sprain of left ankle, unspecified ligament, initial encounter  Contusion of skin of lip, initial encounter      NEW MEDICATIONS STARTED DURING THIS VISIT:  Discharge Medication List as of 12/14/2016  7:02 PM          This chart was dictated using voice recognition software/Dragon. Despite best efforts to proofread, errors can occur which can change the meaning. Any change was purely unintentional.    Gasper Lloyd 12/14/16 2006    Arnaldo Natal, MD 12/14/16 [phone]

## 2017-01-13 DIAGNOSIS — R7303 Prediabetes: Secondary | ICD-10-CM | POA: Diagnosis not present

## 2017-01-13 DIAGNOSIS — E7800 Pure hypercholesterolemia, unspecified: Secondary | ICD-10-CM | POA: Diagnosis not present

## 2017-01-24 DIAGNOSIS — R7303 Prediabetes: Secondary | ICD-10-CM | POA: Diagnosis not present

## 2017-01-24 DIAGNOSIS — E7800 Pure hypercholesterolemia, unspecified: Secondary | ICD-10-CM | POA: Diagnosis not present

## 2017-03-11 DIAGNOSIS — H2013 Chronic iridocyclitis, bilateral: Secondary | ICD-10-CM | POA: Diagnosis not present

## 2017-03-11 DIAGNOSIS — E119 Type 2 diabetes mellitus without complications: Secondary | ICD-10-CM | POA: Diagnosis not present

## 2017-03-11 DIAGNOSIS — H16223 Keratoconjunctivitis sicca, not specified as Sjogren's, bilateral: Secondary | ICD-10-CM | POA: Diagnosis not present

## 2017-03-11 DIAGNOSIS — H524 Presbyopia: Secondary | ICD-10-CM | POA: Diagnosis not present

## 2017-03-11 DIAGNOSIS — H2513 Age-related nuclear cataract, bilateral: Secondary | ICD-10-CM | POA: Diagnosis not present

## 2017-04-03 DIAGNOSIS — M79631 Pain in right forearm: Secondary | ICD-10-CM | POA: Diagnosis not present

## 2017-04-03 DIAGNOSIS — Z043 Encounter for examination and observation following other accident: Secondary | ICD-10-CM | POA: Diagnosis not present

## 2017-04-03 DIAGNOSIS — I10 Essential (primary) hypertension: Secondary | ICD-10-CM | POA: Diagnosis not present

## 2017-04-03 DIAGNOSIS — R51 Headache: Secondary | ICD-10-CM | POA: Diagnosis not present

## 2017-04-03 DIAGNOSIS — S0993XA Unspecified injury of face, initial encounter: Secondary | ICD-10-CM | POA: Diagnosis not present

## 2017-04-03 DIAGNOSIS — J984 Other disorders of lung: Secondary | ICD-10-CM | POA: Diagnosis not present

## 2017-04-03 DIAGNOSIS — R040 Epistaxis: Secondary | ICD-10-CM | POA: Diagnosis not present

## 2017-04-03 DIAGNOSIS — W19XXXA Unspecified fall, initial encounter: Secondary | ICD-10-CM | POA: Diagnosis not present

## 2017-04-03 DIAGNOSIS — S022XXA Fracture of nasal bones, initial encounter for closed fracture: Secondary | ICD-10-CM | POA: Diagnosis not present

## 2017-04-03 DIAGNOSIS — M542 Cervicalgia: Secondary | ICD-10-CM | POA: Diagnosis not present

## 2017-04-03 DIAGNOSIS — X58XXXA Exposure to other specified factors, initial encounter: Secondary | ICD-10-CM | POA: Diagnosis not present

## 2017-04-03 DIAGNOSIS — S0121XA Laceration without foreign body of nose, initial encounter: Secondary | ICD-10-CM | POA: Diagnosis not present

## 2017-04-03 DIAGNOSIS — S022XXB Fracture of nasal bones, initial encounter for open fracture: Secondary | ICD-10-CM | POA: Diagnosis not present

## 2017-04-07 DIAGNOSIS — S022XXA Fracture of nasal bones, initial encounter for closed fracture: Secondary | ICD-10-CM | POA: Diagnosis not present

## 2017-04-08 ENCOUNTER — Inpatient Hospital Stay: Admission: RE | Admit: 2017-04-08 | Payer: PPO | Source: Ambulatory Visit

## 2017-04-12 ENCOUNTER — Encounter: Admission: RE | Payer: Self-pay | Source: Ambulatory Visit

## 2017-04-12 ENCOUNTER — Ambulatory Visit: Admission: RE | Admit: 2017-04-12 | Payer: PPO | Source: Ambulatory Visit | Admitting: Unknown Physician Specialty

## 2017-04-12 SURGERY — CLOSED REDUCTION, FRACTURE, NASAL BONE
Anesthesia: Choice

## 2017-04-29 ENCOUNTER — Ambulatory Visit
Admission: RE | Admit: 2017-04-29 | Discharge: 2017-04-29 | Disposition: A | Discharge status: Home / Self Care | Payer: PPO | Source: Ambulatory Visit | Attending: Gastroenterology | Admitting: Gastroenterology

## 2017-04-29 ENCOUNTER — Ambulatory Visit: Payer: PPO | Admitting: Anesthesiology

## 2017-04-29 ENCOUNTER — Encounter
Admission: RE | Disposition: A | Discharge status: Home / Self Care | Payer: Self-pay | Source: Ambulatory Visit | Attending: Gastroenterology

## 2017-04-29 DIAGNOSIS — Z87891 Personal history of nicotine dependence: Secondary | ICD-10-CM | POA: Insufficient documentation

## 2017-04-29 DIAGNOSIS — R634 Abnormal weight loss: Secondary | ICD-10-CM | POA: Insufficient documentation

## 2017-04-29 DIAGNOSIS — K5730 Diverticulosis of large intestine without perforation or abscess without bleeding: Secondary | ICD-10-CM | POA: Insufficient documentation

## 2017-04-29 DIAGNOSIS — D709 Neutropenia, unspecified: Secondary | ICD-10-CM | POA: Diagnosis not present

## 2017-04-29 DIAGNOSIS — Z7982 Long term (current) use of aspirin: Secondary | ICD-10-CM | POA: Diagnosis not present

## 2017-04-29 DIAGNOSIS — Z1211 Encounter for screening for malignant neoplasm of colon: Secondary | ICD-10-CM | POA: Diagnosis not present

## 2017-04-29 DIAGNOSIS — Z8371 Family history of colonic polyps: Secondary | ICD-10-CM | POA: Diagnosis not present

## 2017-04-29 HISTORY — PX: COLONOSCOPY WITH PROPOFOL: SHX5780

## 2017-04-29 LAB — CBC WITH DIFFERENTIAL/PLATELET
Basophils Absolute: 0 10*3/uL (ref 0–0.1)
Basophils Relative: 1 %
Eosinophils Absolute: 0.2 10*3/uL (ref 0–0.7)
Eosinophils Relative: 6 %
HCT: 33 % — ABNORMAL LOW (ref 35.0–47.0)
HEMOGLOBIN: 11.1 g/dL — AB (ref 12.0–16.0)
LYMPHS ABS: 1.1 10*3/uL (ref 1.0–3.6)
LYMPHS PCT: 34 %
MCH: 33.8 pg (ref 26.0–34.0)
MCHC: 33.7 g/dL (ref 32.0–36.0)
MCV: 100.2 fL — AB (ref 80.0–100.0)
Monocytes Absolute: 0.4 10*3/uL (ref 0.2–0.9)
Monocytes Relative: 11 %
NEUTROS PCT: 48 %
Neutro Abs: 1.6 10*3/uL (ref 1.4–6.5)
Platelets: 208 10*3/uL (ref 150–440)
RBC: 3.29 MIL/uL — AB (ref 3.80–5.20)
RDW: 13.5 % (ref 11.5–14.5)
WBC: 3.4 10*3/uL — AB (ref 3.6–11.0)

## 2017-04-29 SURGERY — COLONOSCOPY WITH PROPOFOL
Anesthesia: General

## 2017-04-29 MED ORDER — PHENYLEPHRINE HCL 10 MG/ML IJ SOLN
INTRAMUSCULAR | Status: AC
  Filled 2017-04-29: qty 1

## 2017-04-29 MED ORDER — LIDOCAINE HCL (CARDIAC) 20 MG/ML IV SOLN
INTRAVENOUS | Status: DC | PRN
  Administered 2017-04-29: 40 mg via INTRAVENOUS

## 2017-04-29 MED ORDER — SODIUM CHLORIDE 0.9 % IV SOLN
INTRAVENOUS | Status: DC
  Administered 2017-04-29 (×2): via INTRAVENOUS

## 2017-04-29 MED ORDER — LIDOCAINE HCL (PF) 2 % IJ SOLN
INTRAMUSCULAR | Status: AC
  Filled 2017-04-29: qty 10

## 2017-04-29 MED ORDER — SODIUM CHLORIDE 0.9 % IV SOLN: INTRAVENOUS | Status: DC

## 2017-04-29 MED ORDER — PROPOFOL 500 MG/50ML IV EMUL
INTRAVENOUS | Status: DC | PRN
  Administered 2017-04-29: 125 ug/kg/min via INTRAVENOUS

## 2017-04-29 MED ORDER — PROPOFOL 500 MG/50ML IV EMUL
INTRAVENOUS | Status: AC
  Filled 2017-04-29: qty 50

## 2017-04-29 MED ORDER — PROPOFOL 10 MG/ML IV BOLUS
INTRAVENOUS | Status: DC | PRN
  Administered 2017-04-29: 40 mg via INTRAVENOUS

## 2017-04-29 NOTE — Anesthesia Post-op Follow-up Note (Signed)
Anesthesia QCDR form completed.        

## 2017-04-29 NOTE — Anesthesia Postprocedure Evaluation (Signed)
Anesthesia Post Note  Patient: Angelica Whitney  Procedure(s) Performed: COLONOSCOPY WITH PROPOFOL (N/A )  Patient location during evaluation: Endoscopy Anesthesia Type: General Level of consciousness: awake and alert Pain management: pain level controlled Vital Signs Assessment: post-procedure vital signs reviewed and stable Respiratory status: spontaneous breathing and respiratory function stable Cardiovascular status: stable Anesthetic complications: no     Last Vitals:  Vitals:   04/29/17 1028 04/29/17 1038  BP: 124/74 124/72  Pulse: 74 72  Resp: 19 18  Temp:    SpO2: 99% 100%    Last Pain:  Vitals:   04/29/17 1008  TempSrc: Tympanic  PainSc:                  Angelica Whitney

## 2017-04-29 NOTE — Op Note (Signed)
[facility] Gastroenterology Patient Name: Angelica Whitney Procedure Date: 04/29/2017 9:36 AM MRN: [id number] Account #: [id number] Date of Birth: May 31, 1941 Admit Type: Outpatient Age: 76 Room: [facility] ENDO ROOM 1 Gender: Female Note Status: Finalized Procedure:            Colonoscopy Indications:          Family history of colonic polyps in a first-degree                        relative, Weight loss Providers:            Christena Deem, MD Referring MD:         Marisue Ivan (Referring MD) Medicines:            Monitored Anesthesia Care Complications:        No immediate complications. Procedure:            Pre-Anesthesia Assessment:                       - ASA Grade Assessment: III - A patient with severe                        systemic disease.                       After obtaining informed consent, the colonoscope was                        passed under direct vision. Throughout the procedure,                        the patient's blood pressure, pulse, and oxygen                        saturations were monitored continuously. The                        Colonoscope was introduced through the anus and                        advanced to the the cecum, identified by appendiceal                        orifice and ileocecal valve. The colonoscopy was                        performed without difficulty. The patient tolerated the                        procedure well. The quality of the bowel preparation                        was good. Findings:      Multiple small and large-mouthed diverticula were found in the sigmoid       colon and ascending colon.      The retroflexed view of the distal rectum and anal verge was normal and       showed no anal or rectal abnormalities.      The digital rectal exam was normal. Impression:           - Diverticulosis in the sigmoid colon  and in the                        ascending colon.                       - The distal  rectum and anal verge are normal on                        retroflexion view.                       - No specimens collected. Recommendation:       - Discharge patient to home. Procedure Code(s):    --- Professional ---                       [phone], Colonoscopy, flexible; diagnostic, including                        collection of specimen(s) by brushing or washing, when                        performed (separate procedure) Diagnosis Code(s):    --- Professional ---                       Z83.71, Family history of colonic polyps                       R63.4, Abnormal weight loss                       K57.30, Diverticulosis of large intestine without                        perforation or abscess without bleeding CPT copyright 2016 American Medical Association. All rights reserved. The codes documented in this report are preliminary and upon coder review may  be revised to meet current compliance requirements. Christena Deem, MD 04/29/2017 10:06:18 AM This report has been signed electronically. Number of Addenda: 0 Note Initiated On: 04/29/2017 9:36 AM Scope Withdrawal Time: 0 hours 8 minutes 19 seconds  Total Procedure Duration: 0 hours 18 minutes 51 seconds       [facility]

## 2017-04-29 NOTE — Anesthesia Preprocedure Evaluation (Signed)
Anesthesia Evaluation  Patient identified by MRN, date of birth, ID band Patient awake    Reviewed: Allergy & Precautions, NPO status , Patient's Chart, lab work & pertinent test results  History of Anesthesia Complications Negative for: history of anesthetic complications  Airway Mallampati: II       Dental  (+) Upper Dentures, Poor Dentition, Missing, Chipped   Pulmonary neg sleep apnea, neg COPD, former smoker,  Sarcoidosis          Cardiovascular (-) hypertension(-) Past MI and (-) CHF (-) dysrhythmias (-) Valvular Problems/Murmurs     Neuro/Psych neg Seizures    GI/Hepatic Neg liver ROS, neg GERD  ,  Endo/Other  neg diabetes  Renal/GU negative Renal ROS     Musculoskeletal   Abdominal   Peds  Hematology   Anesthesia Other Findings   Reproductive/Obstetrics                             Anesthesia Physical Anesthesia Plan  ASA: III  Anesthesia Plan: General   Post-op Pain Management:    Induction: Intravenous  PONV Risk Score and Plan: Propofol infusion  Airway Management Planned: Nasal Cannula  Additional Equipment:   Intra-op Plan:   Post-operative Plan:   Informed Consent: I have reviewed the patients History and Physical, chart, labs and discussed the procedure including the risks, benefits and alternatives for the proposed anesthesia with the patient or authorized representative who has indicated his/her understanding and acceptance.     Plan Discussed with:   Anesthesia Plan Comments:         Anesthesia Quick Evaluation

## 2017-04-29 NOTE — Transfer of Care (Signed)
Immediate Anesthesia Transfer of Care Note  Patient: Angelica Whitney  Procedure(s) Performed: COLONOSCOPY WITH PROPOFOL (N/A )  Patient Location: Endoscopy Unit  Anesthesia Type:General  Level of Consciousness: awake and patient cooperative  Airway & Oxygen Therapy: Patient Spontanous Breathing and Patient connected to nasal cannula oxygen  Post-op Assessment: Report given to RN, Post -op Vital signs reviewed and stable and Patient moving all extremities X 4  Post vital signs: Reviewed and stable  Last Vitals:  Vitals:   04/29/17 1028 04/29/17 1038  BP: 124/74 124/72  Pulse: 74 72  Resp: 19 18  Temp:    SpO2: 99% 100%    Last Pain:  Vitals:   04/29/17 1008  TempSrc: Tympanic  PainSc:          Complications: No apparent anesthesia complications

## 2017-04-29 NOTE — H&P (Signed)
Outpatient short stay form Pre-procedure 04/29/2017 9:10 AM Angelica Deem MD  Primary Physician: Dr Marisue Ivan  Reason for visit:  Colonoscopy  History of present illness:  Patient is a 76 year old female presenting today as above. She has a family history of colon polyps her primary relative, brother. She does take 81 mg aspirin daily there is been held. She takes no other aspirin products or blood thinning agents. She tolerated her prep well.  Patient does have a history of neutropenia of uncertain etiology. We will check her CBC with differential today prior to proceeding as is been over 3 months since her last one.    Current Facility-Administered Medications:  .  0.9 %  sodium chloride infusion, , Intravenous, Continuous, Angelica Deem, MD, Last Rate: 20 mL/hr at 04/29/17 0848 .  0.9 %  sodium chloride infusion, , Intravenous, Continuous, Angelica Deem, MD  Prescriptions Prior to Admission  Medication Sig Dispense Refill Last Dose  . ibuprofen (ADVIL,MOTRIN) 200 MG tablet Take 400 mg by mouth every 6 (six) hours as needed for mild pain.   Past Month at Unknown time  . aspirin 81 MG chewable tablet Chew 81 mg by mouth daily.   04/03/2017  . Calcium Carbonate-Vitamin D 600-400 MG-UNIT tablet Take 2 tablets by mouth daily.   Not Taking at Unknown time  . Glucosamine HCl (GLUCOSAMINE PO) Take 1 tablet by mouth daily.   Not Taking at Unknown time  . HYDROcodone-acetaminophen (NORCO) 5-325 MG tablet Take 1 tablet by mouth every 4 (four) hours as needed for moderate pain. 30 tablet 0 04/03/2017  . Liniments (SALONPAS PAIN RELIEF PATCH EX) Apply 1 patch topically daily as needed.   Not Taking at Unknown time  . OVER THE COUNTER MEDICATION Take 1 tablet by mouth daily. Cholesterol Health Supplement   04/03/2017  . predniSONE (DELTASONE) 5 MG tablet Take 2.5 mg by mouth daily.   01/27/2017     Allergies  Allergen Reactions  . Caffeine Other (See Comments)    Kept her  awake  . Levofloxacin Other (See Comments)    "Disabled her"   . Codeine Nausea And Vomiting and Anxiety     Past Medical History:  Diagnosis Date  . Rheumatic aortic insufficiency    rheumatic fever as a child    Review of systems:      Physical Exam    Heart and lungs: Regular rate and rhythm without rub or gallop, lungs are bilaterally clear.    HEENT: Normocephalic atraumatic eyes are anicteric    Other:     Pertinant exam for procedure: Soft nontender nondistended bowel sounds positive normoactive    Planned proceedures: Colonoscopy and indicated procedures. I have discussed the risks benefits and complications of procedures to include not limited to bleeding, infection, perforation and the risk of sedation and the patient wishes to proceed.    Angelica Deem, MD Gastroenterology 04/29/2017  9:10 AM

## 2017-05-02 ENCOUNTER — Encounter: Payer: Self-pay | Admitting: Gastroenterology

## 2017-05-03 DIAGNOSIS — I739 Peripheral vascular disease, unspecified: Secondary | ICD-10-CM | POA: Diagnosis not present

## 2017-05-03 DIAGNOSIS — R634 Abnormal weight loss: Secondary | ICD-10-CM | POA: Diagnosis not present

## 2017-05-03 DIAGNOSIS — D72819 Decreased white blood cell count, unspecified: Secondary | ICD-10-CM | POA: Diagnosis not present

## 2017-05-11 ENCOUNTER — Ambulatory Visit: Payer: PPO | Admitting: Hematology and Oncology

## 2017-05-12 ENCOUNTER — Inpatient Hospital Stay: Payer: PPO | Attending: Hematology and Oncology | Admitting: Hematology and Oncology

## 2017-05-12 ENCOUNTER — Encounter: Payer: Self-pay | Admitting: Hematology and Oncology

## 2017-05-12 ENCOUNTER — Inpatient Hospital Stay: Payer: PPO

## 2017-05-12 ENCOUNTER — Encounter (INDEPENDENT_AMBULATORY_CARE_PROVIDER_SITE_OTHER): Payer: Self-pay

## 2017-05-12 VITALS — BP 104/64 | HR 79 | Temp 98.6°F | Resp 20 | Wt 89.1 lb

## 2017-05-12 DIAGNOSIS — R634 Abnormal weight loss: Secondary | ICD-10-CM | POA: Diagnosis not present

## 2017-05-12 DIAGNOSIS — M3500 Sicca syndrome, unspecified: Secondary | ICD-10-CM | POA: Insufficient documentation

## 2017-05-12 DIAGNOSIS — N6489 Other specified disorders of breast: Secondary | ICD-10-CM

## 2017-05-12 DIAGNOSIS — Z7982 Long term (current) use of aspirin: Secondary | ICD-10-CM | POA: Insufficient documentation

## 2017-05-12 DIAGNOSIS — D869 Sarcoidosis, unspecified: Secondary | ICD-10-CM

## 2017-05-12 DIAGNOSIS — K117 Disturbances of salivary secretion: Secondary | ICD-10-CM

## 2017-05-12 DIAGNOSIS — Z79899 Other long term (current) drug therapy: Secondary | ICD-10-CM | POA: Insufficient documentation

## 2017-05-12 DIAGNOSIS — M069 Rheumatoid arthritis, unspecified: Secondary | ICD-10-CM | POA: Diagnosis not present

## 2017-05-12 DIAGNOSIS — M359 Systemic involvement of connective tissue, unspecified: Secondary | ICD-10-CM

## 2017-05-12 DIAGNOSIS — Z87891 Personal history of nicotine dependence: Secondary | ICD-10-CM | POA: Insufficient documentation

## 2017-05-12 DIAGNOSIS — D539 Nutritional anemia, unspecified: Secondary | ICD-10-CM | POA: Diagnosis not present

## 2017-05-12 DIAGNOSIS — D72819 Decreased white blood cell count, unspecified: Secondary | ICD-10-CM | POA: Insufficient documentation

## 2017-05-12 DIAGNOSIS — K5730 Diverticulosis of large intestine without perforation or abscess without bleeding: Secondary | ICD-10-CM | POA: Diagnosis not present

## 2017-05-12 DIAGNOSIS — I7300 Raynaud's syndrome without gangrene: Secondary | ICD-10-CM | POA: Diagnosis not present

## 2017-05-12 DIAGNOSIS — D649 Anemia, unspecified: Secondary | ICD-10-CM

## 2017-05-12 LAB — CBC WITH DIFFERENTIAL/PLATELET
Basophils Absolute: 0 10*3/uL (ref 0–0.1)
Basophils Relative: 0 %
Eosinophils Absolute: 0.3 10*3/uL (ref 0–0.7)
Eosinophils Relative: 6 %
HCT: 32.7 % — ABNORMAL LOW (ref 35.0–47.0)
Hemoglobin: 11.1 g/dL — ABNORMAL LOW (ref 12.0–16.0)
Lymphocytes Relative: 34 %
Lymphs Abs: 1.7 10*3/uL (ref 1.0–3.6)
MCH: 34.3 pg — ABNORMAL HIGH (ref 26.0–34.0)
MCHC: 33.8 g/dL (ref 32.0–36.0)
MCV: 101.7 fL — ABNORMAL HIGH (ref 80.0–100.0)
Monocytes Absolute: 0.4 10*3/uL (ref 0.2–0.9)
Monocytes Relative: 7 %
Neutro Abs: 2.7 10*3/uL (ref 1.4–6.5)
Neutrophils Relative %: 53 %
Platelets: 260 10*3/uL (ref 150–440)
RBC: 3.22 MIL/uL — ABNORMAL LOW (ref 3.80–5.20)
RDW: 13.7 % (ref 11.5–14.5)
WBC: 5.1 10*3/uL (ref 3.6–11.0)

## 2017-05-12 LAB — COMPREHENSIVE METABOLIC PANEL
ALT: 12 U/L — ABNORMAL LOW (ref 14–54)
AST: 28 U/L (ref 15–41)
Albumin: 3.9 g/dL (ref 3.5–5.0)
Alkaline Phosphatase: 64 U/L (ref 38–126)
Anion gap: 10 (ref 5–15)
BUN: 9 mg/dL (ref 6–20)
CO2: 28 mmol/L (ref 22–32)
Calcium: 9.8 mg/dL (ref 8.9–10.3)
Chloride: 101 mmol/L (ref 101–111)
Creatinine, Ser: 0.77 mg/dL (ref 0.44–1.00)
GFR calc Af Amer: >60 mL/min (ref >60)
GFR calc non Af Amer: >60 mL/min (ref >60)
Glucose, Bld: 96 mg/dL (ref 65–99)
Potassium: 4.2 mmol/L (ref 3.5–5.1)
Sodium: 139 mmol/L (ref 135–145)
Total Bilirubin: 0.8 mg/dL (ref 0.3–1.2)
Total Protein: 9.5 g/dL — ABNORMAL HIGH (ref 6.5–8.1)

## 2017-05-12 LAB — IRON AND TIBC
Iron: 60 ug/dL (ref 28–170)
Saturation Ratios: 21 % (ref 10.4–31.8)
TIBC: 283 ug/dL (ref 250–450)
UIBC: 223 ug/dL

## 2017-05-12 LAB — RETICULOCYTES
RBC.: 3.3 MIL/uL — ABNORMAL LOW (ref 3.80–5.20)
Retic Count, Absolute: 33 10*3/uL (ref 19.0–183.0)
Retic Ct Pct: 1 % (ref 0.4–3.1)

## 2017-05-12 LAB — LACTATE DEHYDROGENASE: LDH: 187 U/L (ref 98–192)

## 2017-05-12 LAB — FERRITIN: Ferritin: 110 ng/mL (ref 11–307)

## 2017-05-12 LAB — FOLATE: Folate: 30 ng/mL (ref >5.9)

## 2017-05-12 LAB — SEDIMENTATION RATE: Sed Rate: 119 mm/hr — ABNORMAL HIGH (ref 0–30)

## 2017-05-12 LAB — URIC ACID: Uric Acid, Serum: 4.3 mg/dL (ref 2.3–6.6)

## 2017-05-12 LAB — TSH: TSH: 3.993 u[IU]/mL (ref 0.350–4.500)

## 2017-05-12 NOTE — Progress Notes (Signed)
[facility] day:  05/12/2017  Chief Complaint: Angelica Whitney is a 76 y.o. female with leukopenia and weight loss who is referred in consultation by Dr. Dion Body for assessment and management.  HPI:  Patient has a history of sarcoidosis, Sjogren's syndrome, rheumatoid arthritis, and Raynaud's disease.  She weighed 96 pounds in 01/24/2017.  She was seen by Dr. Netty Starring on 05/03/2017.  At that time, she noted fevers, chills, and sweats.  Weight was 91 pounds.  She was seen by GI on 12/03/2016.  She denied any symptoms.  She was on prednisone for rheumatoid arthritis.  Weight was 100 pounds.  CBC revealed a hematocrit of 33.6, hemoglobin 11.0, MCV 102.1, platelets 210,000 and white count 5300 with an [place] of  2510.  Screening colonoscopy on 04/29/2017 revealed diverticulosis in the sigmoid colon and in the ascending colon.  CBC on 04/29/2017 revealed a hematocrit of 33.0, hemoglobin 11.1, MCV 100.2, weight was 208,000, white count 3400 with an ANC of 1600. Differential revealed 48% segs, 34% lymphs, 11% monocytes, 6% eosinophils and 1% basophils.  Bilateral screening mammogram on 07/22/2016 revealed a left breast asymmetry.  Diagnostic left mammogram on 08/24/2016 revealed no evidence of malignancy.  Patient uses fresh celery juice, multivitamins, and various "rubs" to help with her RA. She has not been seen by Dr. Meda Coffee (rheumatology) in over a year. She states, "they always trying to push that methotrexate on me. I am not taking that stuff".  Patient has a past medical history of sarcoidosis, for which she has had no treatment since 1969.   She is on long term corticosteroid therapy. Patient was prescribed prednisone 29m daily, however patient states, "I only take half of that. I take 2.578meveryday".  Patient notes a poor appetite. Baseline weight is in the low 100s. She states, 'I have to force myself to eat". Patient's diet is overall poor. She  cannot eat dairy products and vegetable oils. Patient denies ice pica.  Patient is engaged and denies depression. She notes that her daughter just had a "light stroke". Patient has intermittent drenching sweats since menopause (approximately 25 years).  Patient has had a slight cough for the last few days.    Past Medical History:  Diagnosis Date  . Allergy   . Arthritis   . Cataract   . Heart murmur   . Rheumatic aortic insufficiency    rheumatic fever as a child  [facility]    Past [facility] History:  Procedure Laterality Date  . ABDOMINAL HYSTERECTOMY    . BREAST BIOPSY Left 2007   benign  . COLONOSCOPY WITH PROPOFOL N/A 04/29/2017   Procedure: COLONOSCOPY WITH PROPOFOL;  Surgeon: SkLollie SailsMD;  [facility];  Service: Endoscopy;  Laterality: N/A;  . GANGLION CYST EXCISION Right 08/06/2016   Procedure: REMOVAL GANGLION CYST FOOT;  Surgeon: ToSharlotte AlamoDPM;  Location: [facility] ORS;  Service: Podiatry;  Laterality: Right;    Family History  Problem Relation Age of Onset  . Breast cancer Sister 5167. Cancer Sister   . Breast cancer Maternal Aunt 4531. Cancer Maternal Aunt     Social History:  reports that she quit smoking about 31 years ago. She has never used smokeless tobacco. She reports that she does not drink alcohol or use drugs. Patient is a former 1 pack per day smoker; stopped in 1987. She also used to drink socially when she was younger. She  is retired Publishing rights manager. Patient denies exposure to radiation and toxins. The patient is alone today.  Allergies:  Allergies  Allergen Reactions  . Caffeine Other (See Comments)    Kept her awake  . Cevimeline Other (See Comments)    "Disabled her"  . Lactase   . Levofloxacin Other (See Comments)    "Disabled her"   . Codeine Nausea And Vomiting and Anxiety    Current Medications: Current Outpatient Prescriptions  Medication Sig Dispense Refill  . aspirin 81 MG  chewable tablet Chew 81 mg by mouth daily.    . Calcium Carbonate-Vitamin D 600-400 MG-UNIT tablet Take 2 tablets by mouth daily.    Marland Kitchen HYDROcodone-acetaminophen (NORCO) 5-325 MG tablet Take 1 tablet by mouth every 4 (four) hours as needed for moderate pain. 30 tablet 0  . ibuprofen (ADVIL,MOTRIN) 200 MG tablet Take 400 mg by mouth every 6 (six) hours as needed for mild pain.    . Liniments (SALONPAS PAIN RELIEF PATCH EX) Apply 1 patch topically daily as needed.    . Multiple Vitamins-Minerals (MULTIVITAMIN WITH MINERALS) tablet Take 1 tablet by mouth daily.    Marland Kitchen OVER THE COUNTER MEDICATION Take 1 tablet by mouth daily. Cholesterol Health Supplement     No current facility-administered medications for this visit.     Review of Systems:  GENERAL:  Feels "ok".  No fevers. Intermittent sweats.  "Weight problem my whole life".  "I make myself eat".  Progressive weight loss; baseline weight is in low 100s. PERFORMANCE STATUS (ECOG):  1-22 HEENT:  Rhinorrhea.  Small cataract in LEFT eye. Dry eyes and dry mouth.  No visual changes, sore throat, mouth sores or tenderness. Lungs: Cough, sometimes yellow and foamy. No shortness of breath.  No hemoptysis. Cardiac:  No chest pain, palpitations, orthopnea, or PND. GI:  Constipation.  Nausea after pain medication.  No vomiting, diarrhea, melena or hematochezia. GU:  No urgency, frequency, dysuria, or hematuria. Musculoskeletal:  Polyarthralgia secondary to rheumatoid arthritis. Chronic back pain.  No muscle tenderness. Extremities:  No pain or swelling. Skin:  No rashes or skin changes. Neuro:  Food brings on a migraine.  No numbness or weakness, balance or coordination issues. Endocrine:  No diabetes, thyroid issues, hot flashes.  Sweats off/on for 25 years. Psych:  Intermittent anxiety. No mood changes or depression  Pain:  No focal pain. Review of systems:  All other systems reviewed and found to be negative.  Physical Exam: Blood pressure 104/64,  pulse 79, temperature 98.6 F (37 C), temperature source Tympanic, resp. rate 20, weight 89 lb 1 oz (40.4 kg). GENERAL:  Well developed, well nourished, woman sitting comfortably in the exam room in no acute distress. MENTAL STATUS:  Alert and oriented to person, place and time. HEAD:  Styledlack hair.  Normocephalic, atraumatic, face symmetric, no Cushingoid features. EYES:  Brown eyes.  Pupils equal round and reactive to light and accomodation.  No conjunctivitis or scleral icterus. ENT:  Well healed nasal fracture.  Oropharynx clear without lesion.  Tongue normal.  Upper plate.  Missing lower teeth.  Mucous membranes dry.  RESPIRATORY:  Bilateral lower lobe dry crackles (due to RA).  Clear to auscultation without wheezes or rhonchi. CARDIOVASCULAR:  Regular rate and rhythm without murmur, rub or gallop. ABDOMEN:  Soft, non-tender, with active bowel sounds, and no hepatosplenomegaly.  No masses. SKIN:  No rashes, ulcers or lesions. EXTREMITIES:  Left second toe curled over.  No edema, no skin discoloration or tenderness.  No  palpable cords. LYMPH NODES: No palpable cervical, supraclavicular, axillary or inguinal adenopathy  NEUROLOGICAL: Unremarkable. PSYCH:  Appropriate.   No visits with results within 3 Day(s) from this visit.  Latest known visit with results is:  Admission on 04/29/2017, Discharged on 04/29/2017  Component Date Value Ref Range Status  . WBC 04/29/2017 3.4* 3.6 - 11.0 K/uL Final  . RBC 04/29/2017 3.29* 3.80 - 5.20 MIL/uL Final  . Hemoglobin 04/29/2017 11.1* 12.0 - 16.0 g/dL Final  . HCT 04/29/2017 33.0* 35.0 - 47.0 % Final  . MCV 04/29/2017 100.2* 80.0 - 100.0 fL Final  . MCH 04/29/2017 33.8  26.0 - 34.0 pg Final  . MCHC 04/29/2017 33.7  32.0 - 36.0 g/dL Final  . RDW 04/29/2017 13.5  11.5 - 14.5 % Final  . Platelets 04/29/2017 208  150 - 440 K/uL Final  . Neutrophils Relative % 04/29/2017 48  % Final  . Neutro Abs 04/29/2017 1.6  1.4 - 6.5 K/uL Final  .  Lymphocytes Relative 04/29/2017 34  % Final  . Lymphs Abs 04/29/2017 1.1  1.0 - 3.6 K/uL Final  . Monocytes Relative 04/29/2017 11  % Final  . Monocytes Absolute 04/29/2017 0.4  0.2 - 0.9 K/uL Final  . Eosinophils Relative 04/29/2017 6  % Final  . Eosinophils Absolute 04/29/2017 0.2  0 - 0.7 K/uL Final  . Basophils Relative 04/29/2017 1  % Final  . Basophils Absolute 04/29/2017 0.0  0 - 0.1 K/uL Final    Assessment:  NYILAH KIGHT is a 76 y.o. female with mild macrocytic anemia and leukopenia.  She has sarcoidosis, Sjogren's syndrome, rheumatoid arthritis, and Raynaud's disease.  Baseline weight is in the low 100s.  Diet is poor.  Screening colonoscopy on 04/29/2017 revealed diverticulosis in the sigmoid colon and in the ascending colon.  Bilateral screening mammogram on 07/22/2016 revealed a left breast asymmetry.  Diagnostic left mammogram on 08/24/2016 revealed no evidence of malignancy.  She has lost weight over the past few months.  She has xerostomia.  She struggles with eating secondary to her Sjogren's.  She feels limited to eating only a few things secondary to her medical problems.  Exam reveals no adenopathy or hepatosplenomegaly.  Plan: 1.  Discuss mild macrocytic anemia and leukopenia.  Nutritional status is poor.  She likely has anemia of chronic disease. 2.  Discuss chronic low weight and recent weight loss.  She describes feeling very restricted regarding her diet. Discuss nutrition consult to help with food options. 3.  Labs today:  CBC with diff, ferritin, iron studies, retic B12, folate, ESR, TSH, CMP, SPEP, LDH, uric acid, FLCA. 3.  Rx: Salivamax - will send paperwork to see if we can get it approved for Sjogren's  4.  Anticipate CT imaging if laboratory studies do not provide an etiology for patient's progressive weight loss.  5.  Refer to nutritionist for weight loss and dietary choices. 6.  Encourage follow-up with rheumatology. 7.  RTC in 1 week for MD assessment and  to review work-up.    Honor Loh, NP  05/12/2017, 9:28 AM   I saw and evaluated the patient, participating in the key portions of the service and reviewing pertinent diagnostic studies and records.  I reviewed the nurse practitioner's note and agree with the findings and the plan.  The assessment and plan were discussed with the patient.  Multiple questions were asked by the patient and answered.   Nolon Stalls, MD 05/12/2017, 9:28 AM

## 2017-05-12 NOTE — Progress Notes (Signed)
Nutrition Assessment   Reason for Assessment:   Weight loss  ASSESSMENT:  76 year old female with leukopenia and weight loss.  Patient followed by Dr. Mike Gip and currently undergoing work-up.  Past medical history of sarcoidosis, sjogren's syndrome, RA, raynaud's disease, recent colonscopy with diverticulosis  Met with patient today and patient's neighbor and neighbor's daughter.  Patient reports appetite is decreased mainly because of dry mouth.  Feels that when she eats dairy products and anything cooked in vegetable she her dry mouth gets worse.  Reports she can tolerate olive oil.  Reports that breakfast is her best meal (may have cereal and egg or egg and bacon sandwich, sometimes pastry).  For lunch reports may eat chicken, tuna or [place] salad mixed with olive oil based mayo and crackers.  For dinner may have chicken and rice soup or beef and vegetables.    Reports that she uses biotene, salt water and baking soda rinses for dry mouth.  Patient reports "I know what will make me gain weight but I don't want to take predinsone any more."    Nutrition Focused Physical Exam: deferred, patient wearing coat, gloves, cold this am  Medications: Calcium carbonate, Vit D, MVI  Labs: reviewed  Anthropometrics:   Height: 61f, 10 inches Weight: 89 lb 1 oz UBW: 100s back in May of 2018 BMI: 18  11% weight loss in the last 5 months   Estimated Energy Needs  Kcals: 1200-1400 calories/d Protein: 60-70 g/d Fluid: 1.4 L/d  NUTRITION DIAGNOSIS: Inadequate oral intake related to dry mouth, poor appetite as evidenced by 11% weight loss in 5 months   INTERVENTION:   Discussed strategies to help with dry mouth. Discussed ways to increase calories and protein to promote weight gain. Fact sheet given Provided patient with alternative oral nutrition supplements to try that are not milk-based.  Also provided patient with dairy free recipes of shakes to try for added calories and protein.  Patient reports she uses almond milk which can be used in these recipes.  Contact information provided    MONITORING, EVALUATION, GOAL: Patient will consume adequate calories and protein to prevent further weight loss   NEXT VISIT: November 1 after MD appt  Angelica Whitney B. AZenia Resides RMedia LLiberty LakeRegistered Dietitian [phone](pager)

## 2017-05-12 NOTE — Progress Notes (Signed)
Patient here today as new evaluation regarding weight loss/leukopenia.  Referred by Dr. Cordelia Poche.

## 2017-05-13 LAB — MULTIPLE MYELOMA PANEL, SERUM
Albumin SerPl Elph-Mcnc: 3.4 g/dL (ref 2.9–4.4)
Albumin/Glob SerPl: 0.6 — ABNORMAL LOW (ref 0.7–1.7)
Alpha 1: 0.2 g/dL (ref 0.0–0.4)
Alpha2 Glob SerPl Elph-Mcnc: 1 g/dL (ref 0.4–1.0)
B-Globulin SerPl Elph-Mcnc: 1.3 g/dL (ref 0.7–1.3)
Gamma Glob SerPl Elph-Mcnc: 3.2 g/dL — ABNORMAL HIGH (ref 0.4–1.8)
Globulin, Total: 5.7 g/dL — ABNORMAL HIGH (ref 2.2–3.9)
IgA: 469 mg/dL — ABNORMAL HIGH (ref 64–422)
IgG (Immunoglobin G), Serum: 3450 mg/dL — ABNORMAL HIGH (ref 700–1600)
IgM (Immunoglobulin M), Srm: 205 mg/dL (ref 26–217)
Total Protein ELP: 9.1 g/dL — ABNORMAL HIGH (ref 6.0–8.5)

## 2017-05-13 LAB — KAPPA/LAMBDA LIGHT CHAINS
KAPPA FREE LGHT CHN: 74.6 mg/L — AB (ref 3.3–19.4)
Kappa, lambda light chain ratio: 0.99 (ref 0.26–1.65)
LAMDA FREE LIGHT CHAINS: 75.4 mg/L — AB (ref 5.7–26.3)

## 2017-05-13 LAB — VITAMIN B12: Vitamin B-12: 549 pg/mL (ref 180–914)

## 2017-05-14 DIAGNOSIS — K117 Disturbances of salivary secretion: Secondary | ICD-10-CM | POA: Insufficient documentation

## 2017-05-14 DIAGNOSIS — M359 Systemic involvement of connective tissue, unspecified: Secondary | ICD-10-CM

## 2017-05-19 ENCOUNTER — Inpatient Hospital Stay: Payer: PPO

## 2017-05-19 ENCOUNTER — Other Ambulatory Visit: Payer: Self-pay | Admitting: *Deleted

## 2017-05-19 ENCOUNTER — Inpatient Hospital Stay: Payer: PPO | Attending: Hematology and Oncology | Admitting: Hematology and Oncology

## 2017-05-19 VITALS — BP 125/78 | HR 85 | Temp 96.9°F | Resp 18 | Wt 89.6 lb

## 2017-05-19 DIAGNOSIS — Z803 Family history of malignant neoplasm of breast: Secondary | ICD-10-CM

## 2017-05-19 DIAGNOSIS — D72819 Decreased white blood cell count, unspecified: Secondary | ICD-10-CM | POA: Diagnosis not present

## 2017-05-19 DIAGNOSIS — K5730 Diverticulosis of large intestine without perforation or abscess without bleeding: Secondary | ICD-10-CM | POA: Diagnosis not present

## 2017-05-19 DIAGNOSIS — G8929 Other chronic pain: Secondary | ICD-10-CM | POA: Diagnosis not present

## 2017-05-19 DIAGNOSIS — Z79899 Other long term (current) drug therapy: Secondary | ICD-10-CM | POA: Diagnosis not present

## 2017-05-19 DIAGNOSIS — K117 Disturbances of salivary secretion: Secondary | ICD-10-CM | POA: Diagnosis not present

## 2017-05-19 DIAGNOSIS — D649 Anemia, unspecified: Secondary | ICD-10-CM | POA: Diagnosis not present

## 2017-05-19 DIAGNOSIS — R634 Abnormal weight loss: Secondary | ICD-10-CM

## 2017-05-19 DIAGNOSIS — D508 Other iron deficiency anemias: Secondary | ICD-10-CM

## 2017-05-19 DIAGNOSIS — I7300 Raynaud's syndrome without gangrene: Secondary | ICD-10-CM | POA: Insufficient documentation

## 2017-05-19 DIAGNOSIS — D869 Sarcoidosis, unspecified: Secondary | ICD-10-CM

## 2017-05-19 DIAGNOSIS — M3500 Sicca syndrome, unspecified: Secondary | ICD-10-CM | POA: Diagnosis not present

## 2017-05-19 DIAGNOSIS — M549 Dorsalgia, unspecified: Secondary | ICD-10-CM | POA: Diagnosis not present

## 2017-05-19 DIAGNOSIS — Z7982 Long term (current) use of aspirin: Secondary | ICD-10-CM | POA: Diagnosis not present

## 2017-05-19 DIAGNOSIS — M069 Rheumatoid arthritis, unspecified: Secondary | ICD-10-CM | POA: Insufficient documentation

## 2017-05-19 DIAGNOSIS — H04123 Dry eye syndrome of bilateral lacrimal glands: Secondary | ICD-10-CM | POA: Diagnosis not present

## 2017-05-19 DIAGNOSIS — Z90710 Acquired absence of both cervix and uterus: Secondary | ICD-10-CM | POA: Insufficient documentation

## 2017-05-19 DIAGNOSIS — M1990 Unspecified osteoarthritis, unspecified site: Secondary | ICD-10-CM | POA: Insufficient documentation

## 2017-05-19 DIAGNOSIS — Z87891 Personal history of nicotine dependence: Secondary | ICD-10-CM | POA: Diagnosis not present

## 2017-05-19 NOTE — Progress Notes (Signed)
Patient offers no complaints today.  She brought her vitamins.  Updated medication list.  She met with Joli, Nutritionist last week after appt.  States she has added clear Boost and is tolerating it well and likes the test.  Patient got her saliva max and is using it.  States it is helping.

## 2017-05-19 NOTE — Progress Notes (Signed)
[facility] day:  05/19/2017  Chief Complaint: Angelica Whitney is a 75 y.o. female with leukopenia and weight loss who is seen for review of work-up and discussion regarding direction of therapy.  HPI:  The patient was last seen in the medical oncology clinic on 05/12/2017.  At that time, she was seen for initial consultation.  She had a mild macrocytic anemia and leukopenia.  She has sarcoidosis, Sjogren's syndrome, rheumatoid arthritis, and Raynaud's disease.  She had lost weight over the past few months.  She has xerostomia and struggles with eating secondary to her Sjogren's.  She felt limited to eating only a few things secondary to her medical problems.  Exam revealed no adenopathy or hepatosplenomegaly.  Work-up revealed a hematocrit 33.0, hemoglobin 11.1, MCV 100.2, platelets 208,000, white count 3400 with an ANC of 1600. Differential was unremarkable.  CMP revealed an elevated protein of 9.5. SPEP revealed an apparent polyclonal gammopathy with IgG and IgA, kappa and lambda light chain increased.  Kappa and lambda free light ratio was normal (0.99). Ferritin was 110.  Iron studies included a saturation of 21% a TIBC of 283. Reticulocyte count was 1% (inappropriately low).  Sedimentation rate was 119 (elevated). B12 was 549 folate 30.  TSH was normal. LDH and uric acid were normal.  At last visit, we encouraged follow-up with rheumatology.  Nutrition was consulted.  During the interim, patient is doing well. She has met with dietician and is trying to employ strategies to help her gain weight. Patient was encouraged to liberalize her diet. She is drinking Ensure once a day. Weight remains unchanged. Patient is using the ordered Salivamax, which is proven to be efficacious in improving her xerostomia.    Past Medical History:  Diagnosis Date  . Allergy   . Arthritis   . Cataract   . Heart murmur   . Rheumatic aortic insufficiency    rheumatic  fever as a child  . Sjogren's syndrome [facility])     Past Surgical History:  Procedure Laterality Date  . ABDOMINAL HYSTERECTOMY    . BREAST BIOPSY Left 2007   benign  . COLONOSCOPY WITH PROPOFOL N/A 04/29/2017   Procedure: COLONOSCOPY WITH PROPOFOL;  Surgeon: Lollie Sails, MD;  Location: [facility] ENDOSCOPY;  Service: Endoscopy;  Laterality: N/A;  . GANGLION CYST EXCISION Right 08/06/2016   Procedure: REMOVAL GANGLION CYST FOOT;  Surgeon: Sharlotte Alamo, DPM;  Location: [facility] ORS;  Service: Podiatry;  Laterality: Right;    Family History  Problem Relation Age of Onset  . Breast cancer Sister 42  . Cancer Sister   . Breast cancer Maternal Aunt 65  . Cancer Maternal Aunt     Social History:  reports that she quit smoking about 31 years ago. She has never used smokeless tobacco. She reports that she does not drink alcohol or use drugs. Patient is a former 1 pack per day smoker; stopped in 1987. She also used to drink socially when she was younger. She is retired Publishing rights manager. Patient denies exposure to radiation and toxins. The patient is alone today.  Allergies:  Allergies  Allergen Reactions  . Caffeine Other (See Comments)    Kept her awake  . Cevimeline Other (See Comments)    "Disabled her"  . Lactase   . Levofloxacin Other (See Comments)    "Disabled her"   . Codeine Nausea And Vomiting and Anxiety    Current Medications: Current Outpatient Prescriptions  Medication  Sig Dispense Refill  . Artificial Saliva (SALIVAMAX) PACK Dissolve 1 packet in 1 oz of water and rinse by mouth 4-8 times per day  2  . Ascorbic Acid (VITAMIN C) 1000 MG tablet Take 1,000 mg by mouth daily.    Marland Kitchen aspirin 81 MG chewable tablet Chew 81 mg by mouth daily.    . Calcium Carbonate-Vitamin D 600-400 MG-UNIT tablet Take 2 tablets by mouth daily.    Marland Kitchen HYDROcodone-acetaminophen (NORCO) 5-325 MG tablet Take 1 tablet by mouth every 4 (four) hours as needed for moderate pain. 30 tablet 0  .  ibuprofen (ADVIL,MOTRIN) 200 MG tablet Take 400 mg by mouth every 6 (six) hours as needed for mild pain.    . Liniments (SALONPAS PAIN RELIEF PATCH EX) Apply 1 patch topically daily as needed.    . Multiple Vitamins-Minerals (MULTIVITAMIN WITH MINERALS) tablet Take 1 tablet by mouth daily.    Marland Kitchen OVER THE COUNTER MEDICATION Take 1 tablet by mouth daily. Cholesterol Health Supplement    . ranitidine (ZANTAC) 150 MG/10ML syrup Take 150 mg by mouth as needed for heartburn.    . sulfacetamide-prednisoLONE (VASOCIDIN) 10-0.23 % ophthalmic solution Place 1 drop into the left eye daily.     No current facility-administered medications for this visit.     Review of Systems:  GENERAL:  Feels "ok".  No fevers. Intermittent sweats.  "Weight problem my whole life". Baseline weight is in low 100s.  Weight stable from last visit. PERFORMANCE STATUS (ECOG):  1-2. HEENT:  Small cataract in LEFT eye. Dry eyes and dry mouth, improved with Salivamax.  No visual changes, sore throat, mouth sores or tenderness. Lungs: No shortness of breath.  No cough.  No hemoptysis. Cardiac:  No chest pain, palpitations, orthopnea, or PND. GI:  Constipation.  Nausea after pain medication.  No vomiting, diarrhea, melena or hematochezia. GU:  No urgency, frequency, dysuria, or hematuria. Musculoskeletal:  Polyarthralgia secondary to rheumatoid arthritis. Chronic back pain.  No muscle tenderness. Extremities:  No pain or swelling. Skin:  No rashes or skin changes. Neuro:  Food brings on a migraine.  No numbness or weakness, balance or coordination issues. Endocrine:  No diabetes, thyroid issues, hot flashes.  Sweats off/on for 25 years. Psych:  Intermittent anxiety. No mood changes or depression  Pain:  No focal pain. Review of systems:  All other systems reviewed and found to be negative.  Physical Exam: Blood pressure 125/78, pulse 85, temperature (!) 96.9 F (36.1 C), temperature source Tympanic, resp. rate 18, weight 89 lb  9 oz (40.6 kg). GENERAL:  Well developed, well nourished, woman sitting comfortably in the exam room in no acute distress. MENTAL STATUS:  Alert and oriented to person, place and time. HEAD:  Styled  black hair.  Normocephalic, atraumatic, face symmetric, no Cushingoid features. EYES:  Brown eyes.  No conjunctivitis or scleral icterus. ENT:  Well healed nasal fracture.  Missing lower teeth. NEUROLOGICAL: Unremarkable. PSYCH:  Appropriate.   No visits with results within 3 Day(s) from this visit.  Latest known visit with results is:  Appointment on 05/12/2017  Component Date Value Ref Range Status  . WBC 05/12/2017 5.1  3.6 - 11.0 K/uL Final  . RBC 05/12/2017 3.22* 3.80 - 5.20 MIL/uL Final  . Hemoglobin 05/12/2017 11.1* 12.0 - 16.0 g/dL Final  . HCT 05/12/2017 32.7* 35.0 - 47.0 % Final  . MCV 05/12/2017 101.7* 80.0 - 100.0 fL Final  . MCH 05/12/2017 34.3* 26.0 - 34.0 pg Final  . MCHC  05/12/2017 33.8  32.0 - 36.0 g/dL Final  . RDW 05/12/2017 13.7  11.5 - 14.5 % Final  . Platelets 05/12/2017 260  150 - 440 K/uL Final  . Neutrophils Relative % 05/12/2017 53  % Final  . Neutro Abs 05/12/2017 2.7  1.4 - 6.5 K/uL Final  . Lymphocytes Relative 05/12/2017 34  % Final  . Lymphs Abs 05/12/2017 1.7  1.0 - 3.6 K/uL Final  . Monocytes Relative 05/12/2017 7  % Final  . Monocytes Absolute 05/12/2017 0.4  0.2 - 0.9 K/uL Final  . Eosinophils Relative 05/12/2017 6  % Final  . Eosinophils Absolute 05/12/2017 0.3  0 - 0.7 K/uL Final  . Basophils Relative 05/12/2017 0  % Final  . Basophils Absolute 05/12/2017 0.0  0 - 0.1 K/uL Final  . Sodium 05/12/2017 139  135 - 145 mmol/L Final  . Potassium 05/12/2017 4.2  3.5 - 5.1 mmol/L Final  . Chloride 05/12/2017 101  101 - 111 mmol/L Final  . CO2 05/12/2017 28  22 - 32 mmol/L Final  . Glucose, Bld 05/12/2017 96  65 - 99 mg/dL Final  . BUN 05/12/2017 9  6 - 20 mg/dL Final  . Creatinine, Ser 05/12/2017 0.77  0.44 - 1.00 mg/dL Final  . Calcium 05/12/2017 9.8   8.9 - 10.3 mg/dL Final  . Total Protein 05/12/2017 9.5* 6.5 - 8.1 g/dL Final  . Albumin 05/12/2017 3.9  3.5 - 5.0 g/dL Final  . AST 05/12/2017 28  15 - 41 U/L Final  . ALT 05/12/2017 12* 14 - 54 U/L Final  . Alkaline Phosphatase 05/12/2017 64  38 - 126 U/L Final  . Total Bilirubin 05/12/2017 0.8  0.3 - 1.2 mg/dL Final  . GFR calc non Af Amer 05/12/2017 >60  >60 mL/min Final  . GFR calc Af Amer 05/12/2017 >60  >60 mL/min Final   Comment: (NOTE) The eGFR has been calculated using the CKD EPI equation. This calculation has not been validated in all clinical situations. eGFR's persistently <60 mL/min signify possible Chronic Kidney Disease.   . Anion gap 05/12/2017 10  5 - 15 Final  . LDH 05/12/2017 187  98 - 192 U/L Final  . Uric Acid, Serum 05/12/2017 4.3  2.3 - 6.6 mg/dL Final  . Ferritin 05/12/2017 110  11 - 307 ng/mL Final  . Iron 05/12/2017 60  28 - 170 ug/dL Final  . TIBC 05/12/2017 283  250 - 450 ug/dL Final  . Saturation Ratios 05/12/2017 21  10.4 - 31.8 % Final  . UIBC 05/12/2017 223  ug/dL Final  . Retic Ct Pct 05/12/2017 1.0  0.4 - 3.1 % Final  . RBC. 05/12/2017 3.30* 3.80 - 5.20 MIL/uL Final  . Retic Count, Absolute 05/12/2017 33.0  19.0 - 183.0 K/uL Final  . Vitamin B-12 05/12/2017 549  180 - 914 pg/mL Final   Comment: (NOTE) This assay is not validated for testing neonatal or myeloproliferative syndrome specimens for Vitamin B12 levels. Performed at [facility], [place] [address] [place], [place] [address]   . Folate 05/12/2017 30.0  >5.9 ng/mL Final  . TSH 05/12/2017 3.993  0.350 - 4.500 uIU/mL Final   Performed by a 3rd Generation assay with a functional sensitivity of <=0.01 uIU/mL.  Marland Kitchen Sed Rate 05/12/2017 119* 0 - 30 mm/hr Final  . IgG (Immunoglobin G), Serum 05/12/2017 3450* 700 - 1,600 mg/dL Final  . IgA 05/12/2017 469* 64 - 422 mg/dL Final  . IgM (Immunoglobulin M), Srm 05/12/2017 205  26 - 217 mg/dL Final  . Total Protein ELP 05/12/2017 9.1* 6.0 - 8.5  g/dL Corrected  . Albumin SerPl Elph-Mcnc 05/12/2017 3.4  2.9 - 4.4 g/dL Corrected  . Alpha 1 05/12/2017 0.2  0.0 - 0.4 g/dL Corrected  . Alpha2 Glob SerPl Elph-Mcnc 05/12/2017 1.0  0.4 - 1.0 g/dL Corrected  . B-Globulin SerPl Elph-Mcnc 05/12/2017 1.3  0.7 - 1.3 g/dL Corrected  . Gamma Glob SerPl Elph-Mcnc 05/12/2017 3.2* 0.4 - 1.8 g/dL Corrected  . M Protein SerPl Elph-Mcnc 05/12/2017 Not Observed  Not Observed g/dL Corrected  . Globulin, Total 05/12/2017 5.7* 2.2 - 3.9 g/dL Corrected  . Albumin/Glob SerPl 05/12/2017 0.6* 0.7 - 1.7 Corrected  . IFE 1 05/12/2017 Comment   Corrected   Comment: (NOTE) An apparent polyclonal gammopathy: IgG and IgA. Kappa and lambda typing appear increased.   . Please Note 05/12/2017 Comment   Corrected   Comment: (NOTE) Protein electrophoresis scan will follow via computer, mail, or courier delivery. Performed At: [facility] [place], [place] [id number] Lindon Romp MD [id number]   . Kappa free light chain 05/12/2017 74.6* 3.3 - 19.4 mg/L Final  . Lamda free light chains 05/12/2017 75.4* 5.7 - 26.3 mg/L Final  . Kappa, lamda light chain ratio 05/12/2017 0.99  0.26 - 1.65 Final   Comment: (NOTE) Performed At: [facility] [address] [place], [place] [id number] Lindon Romp MD GY:[id number]     Assessment:  Angelica Whitney is a 76 y.o. female with mild macrocytic anemia and leukopenia.  She has sarcoidosis, Sjogren's syndrome, rheumatoid arthritis, and Raynaud's disease.  Baseline weight is in the low 100s.  Diet is poor.  Work-up on 05/12/2017 revealed a hematocrit 33.0, hemoglobin 11.1, MCV 100.2, platelets 208,000, white count 3400 with an ANC of 1600.  Differential was unremarkable.   Normal studies included:  SPEP (polyclonal gammopathy), free light ratio, B12, folate, TSH, LDH, uric acid.  Ferritin was 110 with a saturation of 21% a TIBC of 283.  Reticulocyte count was 1%  Sed rate was 119  (elevated).  Screening colonoscopy on 04/29/2017 revealed diverticulosis in the sigmoid colon and in the ascending colon.  Bilateral screening mammogram on 07/22/2016 revealed a left breast asymmetry.  Diagnostic left mammogram on 08/24/2016 revealed no evidence of malignancy.  She has lost weight over the past few months.  She struggles with eating secondary to her Sjogren's. She has xerostomia that has improved with the use of Salivamax.   She feels limited to eating only a few things secondary to her medical problems.  Exam reveals no adenopathy or hepatosplenomegaly.  Plan: 1.  Discuss work-up.  No current evidence of malignancy.  Labs suggest anemia of chronic disease. 2.  Continue Salivamax to help with xerostomia. 3.  Continue to increase protein and calorie intake. Encouraged to increase Ensure intake to three times a day. 4.  Encourage follow-up with rheumatology. 5.  RTC in 1 month for MD assessment and labs (CBC with diff and Cu).    Honor Loh, NP  05/19/2017, 10:40 AM   I saw and evaluated the patient, participating in the key portions of the service and reviewing pertinent diagnostic studies and records.  I reviewed the nurse practitioner's note and agree with the findings and the plan.  The assessment and plan were discussed with the patient.  A few questions were asked by the patient and answered.   Nolon Stalls, MD 05/19/2017, 10:40 AM

## 2017-05-19 NOTE — Progress Notes (Signed)
Nutrition Follow-up:  Nutrition follow-up completed with patient following MD appointment.  Patient with leukopenia and weight loss  Patient report that she has been approved to get the salivamax and started using it on Wednesday of this week. Feels that it is helping with dry mouth.  Reports that she has been trying to eat more high calorie, high protein foods (ie nuts). Has tried the ensure clear as does not like the "milky" shakes. Likes the ensure clear drink so far.    Medications: reviewed  Labs: reviewed  Anthropometrics:   Weight stable at 89 lb 9 oz today as 89 lb 1 oz last week   NUTRITION DIAGNOSIS: Inadequate oral intake improving   INTERVENTION:   Encouraged patient to liberalize diet to promote weight gain at this time.  Reviewed strategies to help with dry mouth and foods to include Reviewed strategies to increase calories and protein which patient is trying to do. Encouraged intake of ensure clear/boost breeze for added nutrition. Discussed ways to add calories and protein to shakes.   Contact information provided to patient again today.    MONITORING, EVALUATION, GOAL: Patient will consume adequate calories and protein to prevent further weight loss   NEXT VISIT: December 6 following MD appt  Angelica Whitney B. Freida Busman, RD, LDN Registered Dietitian [phone] (pager)

## 2017-05-21 ENCOUNTER — Encounter: Payer: Self-pay | Admitting: Hematology and Oncology

## 2017-05-23 ENCOUNTER — Encounter (INDEPENDENT_AMBULATORY_CARE_PROVIDER_SITE_OTHER): Payer: Self-pay | Admitting: Vascular Surgery

## 2017-06-14 ENCOUNTER — Other Ambulatory Visit (INDEPENDENT_AMBULATORY_CARE_PROVIDER_SITE_OTHER): Payer: Self-pay | Admitting: Vascular Surgery

## 2017-06-14 DIAGNOSIS — I739 Peripheral vascular disease, unspecified: Secondary | ICD-10-CM

## 2017-06-15 ENCOUNTER — Encounter (INDEPENDENT_AMBULATORY_CARE_PROVIDER_SITE_OTHER): Payer: Self-pay | Admitting: Vascular Surgery

## 2017-06-15 ENCOUNTER — Encounter (INDEPENDENT_AMBULATORY_CARE_PROVIDER_SITE_OTHER): Payer: Self-pay

## 2017-06-15 ENCOUNTER — Ambulatory Visit (INDEPENDENT_AMBULATORY_CARE_PROVIDER_SITE_OTHER): Payer: PPO

## 2017-06-15 ENCOUNTER — Ambulatory Visit (INDEPENDENT_AMBULATORY_CARE_PROVIDER_SITE_OTHER): Payer: PPO | Admitting: Vascular Surgery

## 2017-06-15 VITALS — BP 125/75 | HR 93 | Resp 15 | Ht 58.5 in | Wt 92.0 lb

## 2017-06-15 DIAGNOSIS — I739 Peripheral vascular disease, unspecified: Secondary | ICD-10-CM | POA: Diagnosis not present

## 2017-06-15 DIAGNOSIS — M0520 Rheumatoid vasculitis with rheumatoid arthritis of unspecified site: Secondary | ICD-10-CM | POA: Insufficient documentation

## 2017-06-15 DIAGNOSIS — I00 Rheumatic fever without heart involvement: Secondary | ICD-10-CM | POA: Diagnosis not present

## 2017-06-15 NOTE — Progress Notes (Signed)
Subjective:    Patient ID: Angelica Whitney, female    DOB: 08-22-1940, 76 y.o.   MRN: [id number] Chief Complaint  Patient presents with  . New Patient (Initial Visit)    ABI    Presents as a new patient referred by Dr. Burnadette Pop for evaluation of "peripheral artery disease".  The patient underwent a screening ABI by our practice on Nov 16, 2016.  She was noted to have moderate right lower extremity arterial disease and mild left lower extremity arterial disease.  The patient does experience pain to the lower extremity, not always associated with activity.  The patient endorses a past medical history of rheumatoid arthritis and a "bulging disc" in her spine.  The patient denies any lifestyle limiting claudication-like symptoms, rest pain or ulceration to the lower extremity.  The patient underwent a repeat ABI today and was found to have no evidence of left lower extremity arterial disease and mild right lower extremity arterial disease.  Right tibials are biphasic.  Left tibials are triphasic.  Patient denies any fever, nausea vomiting.   Review of Systems  Constitutional: Negative.   HENT: Negative.   Eyes: Negative.   Respiratory: Negative.   Cardiovascular:       Bilateral lower extremity pain  Gastrointestinal: Negative.   Endocrine: Negative.   Genitourinary: Negative.   Musculoskeletal: Negative.   Skin: Negative.   Allergic/Immunologic: Negative.   Neurological: Negative.   Hematological: Negative.   Psychiatric/Behavioral: Negative.       Objective:   Physical Exam  Constitutional: She is oriented to person, place, and time. She appears well-developed and well-nourished. No distress.  HENT:  Head: Normocephalic and atraumatic.  Eyes: Conjunctivae are normal. Pupils are equal, round, and reactive to light.  Neck: Normal range of motion.  Cardiovascular: Normal rate, regular rhythm, normal heart sounds and intact distal pulses.  Pulses:      Radial pulses are 2+ on the  right side, and 2+ on the left side.       Dorsalis pedis pulses are 1+ on the right side, and 2+ on the left side.       Posterior tibial pulses are 1+ on the right side, and 2+ on the left side.  Pulmonary/Chest: Effort normal and breath sounds normal.  Musculoskeletal: Normal range of motion. She exhibits no edema.  Neurological: She is alert and oriented to person, place, and time.  Skin: Skin is warm and dry. She is not diaphoretic.  Psychiatric: She has a normal mood and affect. Her behavior is normal. Judgment and thought content normal.  Vitals reviewed.  BP 125/75 (BP Location: Right Arm, Patient Position: Sitting)   Pulse 93   Resp 15   Ht 4' 10.5" (1.486 m)   Wt 92 lb (41.7 kg)   BMI 18.90 kg/m   Past Medical History:  Diagnosis Date  . Allergy   . Arthritis   . Cataract   . Heart murmur   . Rheumatic aortic insufficiency    rheumatic fever as a child  . Sjogren's syndrome (HCC)    Social History   Socioeconomic History  . Marital status: Divorced    Spouse name: Not on file  . Number of children: Not on file  . Years of education: Not on file  . Highest education level: Not on file  Social Needs  . Financial resource strain: Not on file  . Food insecurity - worry: Not on file  . Food insecurity - inability: Not on file  .  Transportation needs - medical: Not on file  . Transportation needs - non-medical: Not on file  Occupational History  . Not on file  Tobacco Use  . Smoking status: Former Smoker    Last attempt to quit: 07/27/1985    Years since quitting: 31.9  . Smokeless tobacco: Never Used  Substance and Sexual Activity  . Alcohol use: No  . Drug use: No  . Sexual activity: No  Other Topics Concern  . Not on file  Social History Narrative  . Not on file   Past Surgical History:  Procedure Laterality Date  . ABDOMINAL HYSTERECTOMY    . BREAST BIOPSY Left 2007   benign  . COLONOSCOPY WITH PROPOFOL N/A 04/29/2017   Procedure: COLONOSCOPY WITH  PROPOFOL;  Surgeon: Christena Deem, MD;  Location: [facility] ENDOSCOPY;  Service: Endoscopy;  Laterality: N/A;  . GANGLION CYST EXCISION Right 08/06/2016   Procedure: REMOVAL GANGLION CYST FOOT;  Surgeon: Linus Galas, DPM;  Location: [facility] ORS;  Service: Podiatry;  Laterality: Right;   Family History  Problem Relation Age of Onset  . Breast cancer Sister 98  . Cancer Sister   . Breast cancer Maternal Aunt 45  . Cancer Maternal Aunt    Allergies  Allergen Reactions  . Caffeine Other (See Comments)    Kept her awake  . Cevimeline Other (See Comments)    "Disabled her"  . Lactase   . Levofloxacin Other (See Comments)    "Disabled her"   . Codeine Nausea And Vomiting and Anxiety      Assessment & Plan:  Presents as a new patient referred by Dr. Burnadette Pop for evaluation of "peripheral artery disease".  The patient underwent a screening ABI by our practice on Nov 16, 2016.  She was noted to have moderate right lower extremity arterial disease and mild left lower extremity arterial disease.  The patient does experience pain to the lower extremity, not always associated with activity.  The patient endorses a past medical history of rheumatoid arthritis and a "bulging disc" in her spine.  The patient denies any lifestyle limiting claudication-like symptoms, rest pain or ulceration to the lower extremity.  The patient underwent a repeat ABI today and was found to have no evidence of left lower extremity arterial disease and mild right lower extremity arterial disease.  Right tibials are biphasic.  Left tibials are triphasic.  Patient denies any fever, nausea vomiting.  1. Peripheral artery disease (HCC) - New Patient with lower extremity claudication-like symptoms not always associated with activity.  The patient has mild right lower extremity arterial occlusive disease.  The patient's symptoms are not lifestyle limiting at this time.  The patient does have a past medical history of rheumatoid arthritis  and herniated disc to the spine that may be a contributing factor to her discomfort.  The patient was encouraged to follow up in 6 months for repeat ABI.  If her symptoms and ABI remained stable we can move her follow-up out yearly.  The patient is to call the office sooner if she should start to experience any worsening in symptoms.  - VAS [place] ABI WITH/WO TBI; Future - VAS [place] LOWER EXTREMITY ARTERIAL DUPLEX; Future  2. Rheumatoid arteritis - Stable This is a contributing factor to the patient's lower extremity claudication.  Current Outpatient Medications on File Prior to Visit  Medication Sig Dispense Refill  . Artificial Saliva (SALIVAMAX) PACK Dissolve 1 packet in 1 oz of water and rinse by mouth 4-8 times per day  2  . Ascorbic Acid (VITAMIN C) 1000 MG tablet Take 1,000 mg by mouth daily.    Marland Kitchen aspirin 81 MG chewable tablet Chew 81 mg by mouth daily.    . bisacodyl (DULCOLAX) 5 MG EC tablet Take by mouth.    . Calcium Carbonate-Vitamin D 600-400 MG-UNIT tablet Take 2 tablets by mouth daily.    Marland Kitchen HYDROcodone-acetaminophen (NORCO) 5-325 MG tablet Take 1 tablet by mouth every 4 (four) hours as needed for moderate pain. 30 tablet 0  . ibuprofen (ADVIL,MOTRIN) 200 MG tablet Take 400 mg by mouth every 6 (six) hours as needed for mild pain.    . Liniments (SALONPAS PAIN RELIEF PATCH EX) Apply 1 patch topically daily as needed.    . Multiple Vitamins-Minerals (MULTIVITAMIN WITH MINERALS) tablet Take 1 tablet by mouth daily.    . prednisoLONE acetate (PRED FORTE) 1 % ophthalmic suspension INSTILL 1 DROP TO LEFT EYE 4 TIMES DAILY X1WK, 1 DROP TWICE DAILY X 1WK, 1 DROP DAILY X1WK, STOP  0   No current facility-administered medications on file prior to visit.     There are no Patient Instructions on file for this visit. No Follow-up on file.   KIMBERLY A STEGMAYER, PA-C

## 2017-06-22 ENCOUNTER — Other Ambulatory Visit: Payer: Self-pay | Admitting: *Deleted

## 2017-06-22 DIAGNOSIS — D649 Anemia, unspecified: Secondary | ICD-10-CM

## 2017-06-23 ENCOUNTER — Inpatient Hospital Stay: Payer: PPO

## 2017-06-23 ENCOUNTER — Encounter: Payer: Self-pay | Admitting: Hematology and Oncology

## 2017-06-23 ENCOUNTER — Inpatient Hospital Stay: Payer: PPO | Attending: Hematology and Oncology

## 2017-06-23 ENCOUNTER — Inpatient Hospital Stay (HOSPITAL_BASED_OUTPATIENT_CLINIC_OR_DEPARTMENT_OTHER): Payer: PPO | Admitting: Hematology and Oncology

## 2017-06-23 VITALS — BP 127/75 | HR 85 | Temp 99.4°F | Resp 18 | Wt 90.0 lb

## 2017-06-23 DIAGNOSIS — K5730 Diverticulosis of large intestine without perforation or abscess without bleeding: Secondary | ICD-10-CM

## 2017-06-23 DIAGNOSIS — R634 Abnormal weight loss: Secondary | ICD-10-CM

## 2017-06-23 DIAGNOSIS — K117 Disturbances of salivary secretion: Secondary | ICD-10-CM | POA: Insufficient documentation

## 2017-06-23 DIAGNOSIS — Z87891 Personal history of nicotine dependence: Secondary | ICD-10-CM | POA: Insufficient documentation

## 2017-06-23 DIAGNOSIS — Z79899 Other long term (current) drug therapy: Secondary | ICD-10-CM | POA: Diagnosis not present

## 2017-06-23 DIAGNOSIS — M069 Rheumatoid arthritis, unspecified: Secondary | ICD-10-CM

## 2017-06-23 DIAGNOSIS — Z7982 Long term (current) use of aspirin: Secondary | ICD-10-CM | POA: Diagnosis not present

## 2017-06-23 DIAGNOSIS — M359 Systemic involvement of connective tissue, unspecified: Secondary | ICD-10-CM

## 2017-06-23 DIAGNOSIS — D539 Nutritional anemia, unspecified: Secondary | ICD-10-CM | POA: Insufficient documentation

## 2017-06-23 DIAGNOSIS — M3500 Sicca syndrome, unspecified: Secondary | ICD-10-CM

## 2017-06-23 DIAGNOSIS — D72819 Decreased white blood cell count, unspecified: Secondary | ICD-10-CM | POA: Diagnosis not present

## 2017-06-23 DIAGNOSIS — D649 Anemia, unspecified: Secondary | ICD-10-CM

## 2017-06-23 DIAGNOSIS — I7300 Raynaud's syndrome without gangrene: Secondary | ICD-10-CM | POA: Diagnosis not present

## 2017-06-23 DIAGNOSIS — N6489 Other specified disorders of breast: Secondary | ICD-10-CM | POA: Diagnosis not present

## 2017-06-23 DIAGNOSIS — D508 Other iron deficiency anemias: Secondary | ICD-10-CM

## 2017-06-23 DIAGNOSIS — D638 Anemia in other chronic diseases classified elsewhere: Secondary | ICD-10-CM

## 2017-06-23 LAB — CBC WITH DIFFERENTIAL/PLATELET
Basophils Absolute: 0.1 10*3/uL (ref 0–0.1)
Basophils Relative: 2 %
Eosinophils Absolute: 0.2 10*3/uL (ref 0–0.7)
Eosinophils Relative: 3 %
HCT: 32.4 % — ABNORMAL LOW (ref 35.0–47.0)
Hemoglobin: 10.9 g/dL — ABNORMAL LOW (ref 12.0–16.0)
Lymphocytes Relative: 28 %
Lymphs Abs: 1.3 10*3/uL (ref 1.0–3.6)
MCH: 33.7 pg (ref 26.0–34.0)
MCHC: 33.6 g/dL (ref 32.0–36.0)
MCV: 100.3 fL — ABNORMAL HIGH (ref 80.0–100.0)
Monocytes Absolute: 0.4 10*3/uL (ref 0.2–0.9)
Monocytes Relative: 8 %
Neutro Abs: 2.9 10*3/uL (ref 1.4–6.5)
Neutrophils Relative %: 59 %
Platelets: 228 10*3/uL (ref 150–440)
RBC: 3.23 MIL/uL — ABNORMAL LOW (ref 3.80–5.20)
RDW: 13.6 % (ref 11.5–14.5)
WBC: 4.8 10*3/uL (ref 3.6–11.0)

## 2017-06-23 NOTE — Progress Notes (Signed)
[facility]-  Cancer Center  Clinic day:  06/23/2017  Chief Complaint: Angelica Whitney is a 76 y.o. female with mild macrocytic anemia and leukopenia who is seen for 1 month assessment.   HPI:  The patient was last seen in the medical oncology clinic on 05/19/2017.  At that time, work-up suggested anemia of chronic disease.  There was no evidence of malignancy.   She had lost weight over the past few months.  She struggled with eating secondary to her Sjogren's.  Xerostomia had improved with Salivamax.   She felt limited to eating only a few things secondary to her medical problems.  Exam revealed no adenopathy or hepatosplenomegaly.  She was encouraged to follow-up with rheumatology.  During the interim, patient has been doing well except for her "arthritis". Patient was encouraged to follow up with her rheumatologist, however she has not done so. Patient has been seen by vascular. She was advised that she had "a little bit of build up".   Patient with continued problems related to her Sjogren's. She is using the Salivamax, however states, "It is not stopping the mucus". Patient notes that she is "cannot eat anything because of her problems with foods causing mucus". Patient has lost 2 pounds.  Patient is using supplement beverages.    Past Medical History:  Diagnosis Date  . Allergy   . Arthritis   . Cataract   . Heart murmur   . Rheumatic aortic insufficiency    rheumatic fever as a child  . Sjogren's syndrome [facility])     Past Surgical History:  Procedure Laterality Date  . ABDOMINAL HYSTERECTOMY    . BREAST BIOPSY Left 2007   benign  . COLONOSCOPY WITH PROPOFOL N/A 04/29/2017   Procedure: COLONOSCOPY WITH PROPOFOL;  Surgeon: Christena Deem, MD;  Location: [facility] ENDOSCOPY;  Service: Endoscopy;  Laterality: N/A;  . GANGLION CYST EXCISION Right 08/06/2016   Procedure: REMOVAL GANGLION CYST FOOT;  Surgeon: Linus Galas, DPM;  Location: [facility] ORS;  Service: Podiatry;   Laterality: Right;    Family History  Problem Relation Age of Onset  . Breast cancer Sister 75  . Cancer Sister   . Breast cancer Maternal Aunt 45  . Cancer Maternal Aunt     Social History:  reports that she quit smoking about 31 years ago. she has never used smokeless tobacco. She reports that she does not drink alcohol or use drugs. Patient is a former 1 pack per day smoker; stopped in 1987. She also used to drink socially when she was younger. She is retired Secretary/administrator. Patient denies exposure to radiation and toxins. The patient is alone today.  Allergies:  Allergies  Allergen Reactions  . Caffeine Other (See Comments)    Kept her awake  . Cevimeline Other (See Comments)    "Disabled her"  . Lactase   . Levofloxacin Other (See Comments)    "Disabled her"   . Codeine Nausea And Vomiting and Anxiety    Current Medications: Current Outpatient Medications  Medication Sig Dispense Refill  . Artificial Saliva (SALIVAMAX) PACK Dissolve 1 packet in 1 oz of water and rinse by mouth 4-8 times per day  2  . Ascorbic Acid (VITAMIN C) 1000 MG tablet Take 1,000 mg by mouth daily.    Marland Kitchen aspirin 81 MG chewable tablet Chew 81 mg by mouth daily.    . bisacodyl (DULCOLAX) 5 MG EC tablet Take by mouth.    . Calcium Carbonate-Vitamin D  600-400 MG-UNIT tablet Take 2 tablets by mouth daily.    Marland Kitchen HYDROcodone-acetaminophen (NORCO) 5-325 MG tablet Take 1 tablet by mouth every 4 (four) hours as needed for moderate pain. 30 tablet 0  . ibuprofen (ADVIL,MOTRIN) 200 MG tablet Take 400 mg by mouth every 6 (six) hours as needed for mild pain.    . Liniments (SALONPAS PAIN RELIEF PATCH EX) Apply 1 patch topically daily as needed.    . Multiple Vitamins-Minerals (MULTIVITAMIN WITH MINERALS) tablet Take 1 tablet by mouth daily.    . prednisoLONE acetate (PRED FORTE) 1 % ophthalmic suspension INSTILL 1 DROP TO LEFT EYE 4 TIMES DAILY X1WK, 1 DROP TWICE DAILY X 1WK, 1 DROP DAILY X1WK, STOP   0   No current facility-administered medications for this visit.     Review of Systems:  GENERAL:  Feels "ok".  No fevers. Intermittent sweats.  "Weight problem my whole life". Baseline weight is in low 100s.  Weight down 2 pounds.  PERFORMANCE STATUS (ECOG):  1-2. HEENT:  Small cataract in LEFT eye. Dry eyes and dry mouth, improved with Salivamax.  No visual changes, sore throat, mouth sores or tenderness. Lungs: No shortness of breath.  No cough.  No hemoptysis. Cardiac:  No chest pain, palpitations, orthopnea, or PND. GI:  Constipation.  Nausea after pain medication.  No vomiting, diarrhea, melena or hematochezia. GU:  No urgency, frequency, dysuria, or hematuria. Musculoskeletal:  Polyarthralgia secondary to rheumatoid arthritis. Chronic back pain.  No muscle tenderness. Extremities:  No pain or swelling. Skin:  No rashes or skin changes. Neuro:  Food brings on a migraine.  No numbness or weakness, balance or coordination issues. Endocrine:  No diabetes, thyroid issues, hot flashes.  Sweats off/on for 25 years. Psych:  Intermittent anxiety. No mood changes or depression  Pain:  No focal pain. Review of systems:  All other systems reviewed and found to be negative.  Physical Exam: Blood pressure 127/75, pulse 85, temperature 99.4 F (37.4 C), temperature source Tympanic, resp. rate 18, weight 90 lb (40.8 kg). GENERAL:  Well developed, well nourished, woman sitting comfortably in the exam room in no acute distress. MENTAL STATUS:  Alert and oriented to person, place and time. HEAD:  Short black hair.  Normocephalic, atraumatic, face symmetric, no Cushingoid features. EYES:  Brown eyes.  Pupils equal round and reactive to light and accomodation.  No conjunctivitis or scleral icterus. ENT:  Well healed nasal fracture.  Oropharynx clear without lesion.  Tongue normal.  Upper plate.  Missing lower teeth.  Mucous membranes dry.  RESPIRATORY:  Bilateral lower lobe dry crackles (due to RA).   Clear to auscultation without wheezes or rhonchi. CARDIOVASCULAR:  Regular rate and rhythm without murmur, rub or gallop. ABDOMEN:  Soft, non-tender, with active bowel sounds, and no hepatosplenomegaly.  No masses. SKIN:  No rashes, ulcers or lesions. EXTREMITIES:  Left second toe curled over.  No edema, no skin discoloration or tenderness.  No palpable cords. LYMPH NODES: No palpable cervical, supraclavicular, axillary or inguinal adenopathy  NEUROLOGICAL: Unremarkable. PSYCH:  Appropriate.   Appointment on 06/23/2017  Component Date Value Ref Range Status  . WBC 06/23/2017 4.8  3.6 - 11.0 K/uL Final  . RBC 06/23/2017 3.23* 3.80 - 5.20 MIL/uL Final  . Hemoglobin 06/23/2017 10.9* 12.0 - 16.0 g/dL Final  . HCT 84/13/2440 32.4* 35.0 - 47.0 % Final  . MCV 06/23/2017 100.3* 80.0 - 100.0 fL Final  . MCH 06/23/2017 33.7  26.0 - 34.0 pg Final  .  MCHC 06/23/2017 33.6  32.0 - 36.0 g/dL Final  . RDW 24/46/2863 13.6  11.5 - 14.5 % Final  . Platelets 06/23/2017 228  150 - 440 K/uL Final  . Neutrophils Relative % 06/23/2017 59  % Final  . Neutro Abs 06/23/2017 2.9  1.4 - 6.5 K/uL Final  . Lymphocytes Relative 06/23/2017 28  % Final  . Lymphs Abs 06/23/2017 1.3  1.0 - 3.6 K/uL Final  . Monocytes Relative 06/23/2017 8  % Final  . Monocytes Absolute 06/23/2017 0.4  0.2 - 0.9 K/uL Final  . Eosinophils Relative 06/23/2017 3  % Final  . Eosinophils Absolute 06/23/2017 0.2  0 - 0.7 K/uL Final  . Basophils Relative 06/23/2017 2  % Final  . Basophils Absolute 06/23/2017 0.1  0 - 0.1 K/uL Final    Assessment:  KAMEO BAINS is a 76 y.o. female with mild macrocytic anemia and leukopenia.  She has sarcoidosis, Sjogren's syndrome, rheumatoid arthritis, and Raynaud's disease.  Baseline weight is in the low 100s.  Diet is poor.  Work-up on 05/12/2017 revealed a hematocrit 33.0, hemoglobin 11.1, MCV 100.2, platelets 208,000, white count 3400 with an ANC of 1600.  Differential was unremarkable.   Normal  studies included:  SPEP (polyclonal gammopathy), free light ratio, B12, folate, TSH, LDH, uric acid.  Ferritin was 110 with a saturation of 21% a TIBC of 283.  Reticulocyte count was 1%  Sed rate was 119 (elevated).  Screening colonoscopy on 04/29/2017 revealed diverticulosis in the sigmoid colon and in the ascending colon.  Bilateral screening mammogram on 07/22/2016 revealed a left breast asymmetry.  Diagnostic left mammogram on 08/24/2016 revealed no evidence of malignancy.  She struggles with eating secondary to her Sjogren's. She has xerostomia that has improved with the use of Salivamax, however she continues to have excessive mucus production. She feels limited to eating only a few things secondary to her medical problems.  Exam reveals no adenopathy or hepatosplenomegaly.  Plan: 1.  Labs today: CBC with diff, copper level. 2.  Patient speak with nutritionist prior to leaving today.  3.  Continue Salivamax to help with xerostomia. 4.  Continue to increase protein and calorie intake. Encouraged patient to increase Ensure intake to three times a day. 5.  Encourage follow-up with rheumatology. 6.  RTC in 1 month for a weight check.  7   RTC in 2 months for MD assessment and labs (CBC with diff).    Angelica Mulling, NP  06/23/2017, 12:02 PM   I saw and evaluated the patient, participating in the key portions of the service and reviewing pertinent diagnostic studies and records.  I reviewed the nurse practitioner's note and agree with the findings and the plan.  The assessment and plan were discussed with the patient.  A few questions were asked by the patient and answered.   Angelica Nay, MD 06/23/2017, 12:02 PM

## 2017-06-23 NOTE — Progress Notes (Signed)
Patient here for follow up with labs today. She states that she is feeling fairly well today. She does have arthritis that makes her feel sore, especially in the morning.

## 2017-06-23 NOTE — Progress Notes (Signed)
Nutrition Follow-up:  Met with patient following MD appointment.  Patient reports mucous is the biggest thing that is effecting intake.  Reports had [place] at restaurant and caused increased mucus.  Reports if she prepares it at home does not have as much mucus production.  Reports she had episode of upset stomach following eating out recently.  Reports that she has been drinking 2 ensure clear drinks per day.  Sometimes makes a smoothie but only uses vegetables and fruit and water (low calorie and no protein)    Thinks the salivamax is helping her.  Medications: salivamax, MVI  Labs: reviewed  Anthropometrics:   Weight 90 lb today slight increase from 89 lb 9 oz on 11/1 (last nutrition visit) but decreased from 92 lb on 11/28.    NUTRITION DIAGNOSIS: Inadequate oral intake continues   INTERVENTION:   Encouraged patient to try swishing and spitting club soda or lemon-lime soda to help with excess mucous.   Reviewed ways to increase calories and protein in diet (ie mixing ensure clear in smoothies, adding protein powder, adding peanut butter to smoothies, increasing condiments, etc).   Encouraged patient to continue to drink ensure clear. Coupons given for boost breeze (another "clear" liquid shake as pt not able to handle "milky" options).   Discussed daily calorie goals with patient and ways to reach those goals.  Once again encouraged patient to liberalize diet restrictions (concerned about sugar, cholesterol).      MONITORING, EVALUATION, GOAL: Patient will consume adequate calories and protein to prevent further weight loss   NEXT VISIT: Feb 7 after MD visit  Angelica Whitney, [place], [place] Registered Dietitian [phone] (pager)

## 2017-06-25 LAB — COPPER, SERUM: Copper: 137 ug/dL (ref 72–166)

## 2017-06-28 ENCOUNTER — Other Ambulatory Visit: Payer: Self-pay | Admitting: *Deleted

## 2017-06-28 DIAGNOSIS — D638 Anemia in other chronic diseases classified elsewhere: Secondary | ICD-10-CM

## 2017-07-21 ENCOUNTER — Ambulatory Visit: Payer: PPO | Admitting: Urgent Care

## 2017-07-21 ENCOUNTER — Telehealth: Payer: Self-pay | Admitting: *Deleted

## 2017-07-21 ENCOUNTER — Inpatient Hospital Stay: Payer: PPO | Attending: Hematology and Oncology | Admitting: Urgent Care

## 2017-07-21 DIAGNOSIS — E7800 Pure hypercholesterolemia, unspecified: Secondary | ICD-10-CM | POA: Diagnosis not present

## 2017-07-21 DIAGNOSIS — R7303 Prediabetes: Secondary | ICD-10-CM | POA: Diagnosis not present

## 2017-07-21 NOTE — Telephone Encounter (Signed)
Patient came in today for weight check.  89.2 lbs  Patient to follow up with MD in February.

## 2017-07-27 ENCOUNTER — Other Ambulatory Visit: Payer: Self-pay | Admitting: Family Medicine

## 2017-07-27 DIAGNOSIS — Z1231 Encounter for screening mammogram for malignant neoplasm of breast: Secondary | ICD-10-CM

## 2017-07-28 DIAGNOSIS — E7800 Pure hypercholesterolemia, unspecified: Secondary | ICD-10-CM | POA: Diagnosis not present

## 2017-07-28 DIAGNOSIS — M3500 Sicca syndrome, unspecified: Secondary | ICD-10-CM | POA: Diagnosis not present

## 2017-07-28 DIAGNOSIS — I7300 Raynaud's syndrome without gangrene: Secondary | ICD-10-CM | POA: Diagnosis not present

## 2017-07-28 DIAGNOSIS — R7303 Prediabetes: Secondary | ICD-10-CM | POA: Diagnosis not present

## 2017-07-28 DIAGNOSIS — Z0000 Encounter for general adult medical examination without abnormal findings: Secondary | ICD-10-CM | POA: Diagnosis not present

## 2017-07-28 DIAGNOSIS — M0579 Rheumatoid arthritis with rheumatoid factor of multiple sites without organ or systems involvement: Secondary | ICD-10-CM | POA: Diagnosis not present

## 2017-07-28 DIAGNOSIS — D638 Anemia in other chronic diseases classified elsewhere: Secondary | ICD-10-CM | POA: Diagnosis not present

## 2017-07-28 DIAGNOSIS — I739 Peripheral vascular disease, unspecified: Secondary | ICD-10-CM | POA: Diagnosis not present

## 2017-07-28 DIAGNOSIS — Z79899 Other long term (current) drug therapy: Secondary | ICD-10-CM | POA: Diagnosis not present

## 2017-07-28 DIAGNOSIS — Z66 Do not resuscitate: Secondary | ICD-10-CM | POA: Insufficient documentation

## 2017-08-08 ENCOUNTER — Other Ambulatory Visit: Payer: PPO

## 2017-08-08 ENCOUNTER — Ambulatory Visit: Payer: PPO

## 2017-08-25 ENCOUNTER — Inpatient Hospital Stay: Payer: PPO

## 2017-08-25 ENCOUNTER — Inpatient Hospital Stay: Payer: PPO | Attending: Hematology and Oncology | Admitting: Hematology and Oncology

## 2017-08-25 ENCOUNTER — Encounter: Payer: Self-pay | Admitting: Hematology and Oncology

## 2017-08-25 ENCOUNTER — Other Ambulatory Visit: Payer: Self-pay

## 2017-08-25 VITALS — BP 109/67 | HR 99 | Temp 98.1°F | Wt 90.0 lb

## 2017-08-25 DIAGNOSIS — D869 Sarcoidosis, unspecified: Secondary | ICD-10-CM | POA: Insufficient documentation

## 2017-08-25 DIAGNOSIS — J310 Chronic rhinitis: Secondary | ICD-10-CM | POA: Insufficient documentation

## 2017-08-25 DIAGNOSIS — R7303 Prediabetes: Secondary | ICD-10-CM | POA: Insufficient documentation

## 2017-08-25 DIAGNOSIS — Z7982 Long term (current) use of aspirin: Secondary | ICD-10-CM | POA: Insufficient documentation

## 2017-08-25 DIAGNOSIS — D638 Anemia in other chronic diseases classified elsewhere: Secondary | ICD-10-CM

## 2017-08-25 DIAGNOSIS — Z87891 Personal history of nicotine dependence: Secondary | ICD-10-CM | POA: Insufficient documentation

## 2017-08-25 DIAGNOSIS — Z8639 Personal history of other endocrine, nutritional and metabolic disease: Secondary | ICD-10-CM | POA: Insufficient documentation

## 2017-08-25 DIAGNOSIS — D539 Nutritional anemia, unspecified: Secondary | ICD-10-CM | POA: Diagnosis not present

## 2017-08-25 DIAGNOSIS — I7300 Raynaud's syndrome without gangrene: Secondary | ICD-10-CM | POA: Insufficient documentation

## 2017-08-25 DIAGNOSIS — K219 Gastro-esophageal reflux disease without esophagitis: Secondary | ICD-10-CM | POA: Insufficient documentation

## 2017-08-25 DIAGNOSIS — R634 Abnormal weight loss: Secondary | ICD-10-CM

## 2017-08-25 DIAGNOSIS — M069 Rheumatoid arthritis, unspecified: Secondary | ICD-10-CM | POA: Insufficient documentation

## 2017-08-25 DIAGNOSIS — G5600 Carpal tunnel syndrome, unspecified upper limb: Secondary | ICD-10-CM | POA: Insufficient documentation

## 2017-08-25 DIAGNOSIS — K5730 Diverticulosis of large intestine without perforation or abscess without bleeding: Secondary | ICD-10-CM | POA: Insufficient documentation

## 2017-08-25 DIAGNOSIS — Z79899 Other long term (current) drug therapy: Secondary | ICD-10-CM | POA: Diagnosis not present

## 2017-08-25 DIAGNOSIS — E7800 Pure hypercholesterolemia, unspecified: Secondary | ICD-10-CM | POA: Insufficient documentation

## 2017-08-25 DIAGNOSIS — K117 Disturbances of salivary secretion: Secondary | ICD-10-CM | POA: Diagnosis not present

## 2017-08-25 DIAGNOSIS — D72819 Decreased white blood cell count, unspecified: Secondary | ICD-10-CM | POA: Diagnosis not present

## 2017-08-25 DIAGNOSIS — M3500 Sicca syndrome, unspecified: Secondary | ICD-10-CM | POA: Diagnosis not present

## 2017-08-25 LAB — CBC WITH DIFFERENTIAL/PLATELET
Basophils Absolute: 0.2 10*3/uL — ABNORMAL HIGH (ref 0–0.1)
Basophils Relative: 6 %
Eosinophils Absolute: 0.2 10*3/uL (ref 0–0.7)
Eosinophils Relative: 6 %
HCT: 33 % — ABNORMAL LOW (ref 35.0–47.0)
Hemoglobin: 11 g/dL — ABNORMAL LOW (ref 12.0–16.0)
Lymphocytes Relative: 34 %
Lymphs Abs: 1.2 10*3/uL (ref 1.0–3.6)
MCH: 34.1 pg — ABNORMAL HIGH (ref 26.0–34.0)
MCHC: 33.2 g/dL (ref 32.0–36.0)
MCV: 102.5 fL — ABNORMAL HIGH (ref 80.0–100.0)
Monocytes Absolute: 0.4 10*3/uL (ref 0.2–0.9)
Monocytes Relative: 10 %
Neutro Abs: 1.6 10*3/uL (ref 1.4–6.5)
Neutrophils Relative %: 44 %
Platelets: 199 10*3/uL (ref 150–440)
RBC: 3.22 MIL/uL — ABNORMAL LOW (ref 3.80–5.20)
RDW: 14.4 % (ref 11.5–14.5)
WBC: 3.6 10*3/uL (ref 3.6–11.0)

## 2017-08-25 NOTE — Progress Notes (Signed)
Nutrition Follow-up:  Met with patient following MD appointment.  Patient reports thought weight would have been higher as has been on predinsone for RA flare since January.  Reports appetite has been good and has been eating. Reports yesterday ate 4 gingersnaps with peanut butter (220 calories) for breakfast, lunch was egg sandwich with tomato and olive oil mayo (ate 100%), drank sprite and then some almond ice cream.  For dinner reports ate 3 oz salmon, 1/2 cup rice and tossed salad with [place] dressing mixed with mayo.  Reports for snack ate few peanut butter crackers.  Has not been drinking ensure clear.  Medications: reviewed  Labs: reviewed  Anthropometrics:   90 lb today, stable from last visit on 12/6.     NUTRITION DIAGNOSIS: Inadequate oral intake continue   INTERVENTION:   Reviewed calorie amount patient should be getting every day to promote weight gain.  Numerous strategies discussed with patient on previous visits to increase calories and protein.   Encouraged intake of ensure clear beverages as well as regular foods ?? If appetite stimulant would be appropriate for patient.  Discussed with NP, Gaspar Bidding.      MONITORING, EVALUATION, GOAL: weight trends, intake   NEXT VISIT: as needed, patient has my contact information  Alylah Blakney B. Zenia Resides, [place], [place] Registered Dietitian [phone] (pager)

## 2017-08-25 NOTE — Progress Notes (Signed)
[place] Clinic day:  08/25/2017  Chief Complaint: Angelica Whitney is a 77 y.o. female with mild macrocytic anemia and leukopenia who is seen for 2 month assessment.   HPI:  The patient was last seen in the medical oncology clinic on 06/23/2017.  At that time, she continued to struggle with eating secondary to her Sjogren's. She had xerostomia that has improved with the use of Salivamax, however she continued to have excessive mucus production. She felt limited to eating only a few things secondary to her medical problems.  Exam revealed no adenopathy or hepatosplenomegaly.  CBC revealed a 32.4, hemoglobin 10.9, MCV 100.3, platelets 228,000, white count 4800 with an ANC of 2900.  She met with nutrition on 06/23/2017.  She met with Dr. Marlowe Sax on 07/28/2017.  She was felt to have an uncontrolled RA flare.  Leflunomide 20 mg q day was discussed. A prednisone taper was written (50 mg starting dose).  She has a follow-up on 09/08/2017.  During the interim, she has felt "ok".  She took leflunomide x 3 days then stopped it as it caused a migraine.  She took prednisone.  She is now down to 1 pill/day (10 mg).  She continues to take Salivamax.  She states that it works well.  She is eating almond milk yogurt instead of Greek yogurt per nutrition.  She has a mammogram on 08/29/2017.   Past Medical History:  Diagnosis Date  . Allergy   . Arthritis   . Cataract   . Heart murmur   . Rheumatic aortic insufficiency    rheumatic fever as a child  . Sjogren's syndrome [facility])     Past Surgical History:  Procedure Laterality Date  . ABDOMINAL HYSTERECTOMY    . BREAST BIOPSY Left 2007   benign  . COLONOSCOPY WITH PROPOFOL N/A 04/29/2017   Procedure: COLONOSCOPY WITH PROPOFOL;  Surgeon: Lollie Sails, MD;  Location: [facility] ENDOSCOPY;  Service: Endoscopy;  Laterality: N/A;  . GANGLION CYST EXCISION Right 08/06/2016   Procedure: REMOVAL GANGLION  CYST FOOT;  Surgeon: Sharlotte Alamo, DPM;  Location: [facility] ORS;  Service: Podiatry;  Laterality: Right;    Family History  Problem Relation Age of Onset  . Breast cancer Sister 48  . Cancer Sister   . Breast cancer Maternal Aunt 48  . Cancer Maternal Aunt     Social History:  reports that she quit smoking about 32 years ago. she has never used smokeless tobacco. She reports that she does not drink alcohol or use drugs. Patient is a former 1 pack per day smoker; stopped in 1987. She also used to drink socially when she was younger. She is retired Publishing rights manager. Patient denies exposure to radiation and toxins.  She lives in [place].  The patient is alone today.  Allergies:  Allergies  Allergen Reactions  . Caffeine Other (See Comments)    Kept her awake  . Cevimeline Other (See Comments)    "Disabled her"  . Lactase   . Levofloxacin Other (See Comments)    "Disabled her"   . Codeine Nausea And Vomiting and Anxiety    Current Medications: Current Outpatient Medications  Medication Sig Dispense Refill  . Artificial Saliva (SALIVAMAX) PACK Dissolve 1 packet in 1 oz of water and rinse by mouth 4-8 times per day  2  . Ascorbic Acid (VITAMIN C) 1000 MG tablet Take 1,000 mg by mouth daily.    Marland Kitchen aspirin 81 MG  chewable tablet Chew 81 mg by mouth daily.    . Calcium Carbonate-Vitamin D 600-400 MG-UNIT tablet Take 2 tablets by mouth daily.    Marland Kitchen ibuprofen (ADVIL,MOTRIN) 200 MG tablet Take 400 mg by mouth every 6 (six) hours as needed for mild pain.    Marland Kitchen leflunomide (ARAVA) 20 MG tablet Take 20 mg by mouth daily.  11  . Liniments (SALONPAS PAIN RELIEF PATCH EX) Apply 1 patch topically daily as needed.    . Multiple Vitamins-Minerals (MULTIVITAMIN WITH MINERALS) tablet Take 1 tablet by mouth daily.    . prednisoLONE acetate (PRED FORTE) 1 % ophthalmic suspension INSTILL 1 DROP TO LEFT EYE 4 TIMES DAILY X1WK, 1 DROP TWICE DAILY X 1WK, 1 DROP DAILY X1WK, STOP  0  . bisacodyl  (DULCOLAX) 5 MG EC tablet Take by mouth.    Marland Kitchen HYDROcodone-acetaminophen (NORCO) 5-325 MG tablet Take 1 tablet by mouth every 4 (four) hours as needed for moderate pain. (Patient not taking: Reported on 08/25/2017) 30 tablet 0   No current facility-administered medications for this visit.     Review of Systems:  GENERAL:  Feels "ok".  No fevers or sweats.  "Weight problem my whole life". Baseline weight is in low 100s.  Weight stable.  PERFORMANCE STATUS (ECOG):  1-2. HEENT:  Small cataract in LEFT eye. Dry eyes and dry mouth, improved with Salivamax.  No visual changes, sore throat, mouth sores or tenderness. Lungs: No shortness of breath.  No cough.  No hemoptysis. Cardiac:  No chest pain, palpitations, orthopnea, or PND. GI:  "Careful what I eat".  No nausea, vomiting, diarrhea, constipation, melena or hematochezia. GU:  No urgency, frequency, dysuria, or hematuria. Musculoskeletal:  Polyarthralgia secondary to rheumatoid arthritis. Chronic back pain.  No muscle tenderness. Extremities:  No pain or swelling. Skin:  No rashes or skin changes. Neuro:  No headache, numbness or weakness, balance or coordination issues. Endocrine:  No diabetes, thyroid issues, hot flashes.  Sweats off/on for 25 years. Psych:  Intermittent anxiety. No mood changes or depression  Pain:  Generalized pain (5 out of 10). Review of systems:  All other systems reviewed and found to be negative.  Physical Exam: Blood pressure 109/67, pulse 99, temperature 98.1 F (36.7 C), temperature source Tympanic, weight 90 lb (40.8 kg). GENERAL:  Thin woman sitting comfortably in the exam room in no acute distress. MENTAL STATUS:  Alert and oriented to person, place and time. HEAD:  Short black hair.  Normocephalic, atraumatic, face symmetric, no Cushingoid features. EYES:  Brown eyes.  Pupils equal round and reactive to light and accomodation.  No conjunctivitis or scleral icterus. ENT:  Well healed nasal fracture.  Oropharynx  clear without lesion.  Tongue normal.  Upper plate.  Missing lower teeth.  Mucous membranes dry.  RESPIRATORY:  Bilateral lower lobe dry crackles (stable, due to RA).  Clear to auscultation without wheezes or rhonchi. CARDIOVASCULAR:  Regular rate and rhythm without murmur, rub or gallop. ABDOMEN:  Soft, non-tender, with active bowel sounds, and no hepatosplenomegaly.  No masses. SKIN:  No rashes, ulcers or lesions. EXTREMITIES:  Arthritic changes in right 2nd and 3rd MCP and left 2nd MCP.  Left wrist deviated.  Left second toe curled over.  No edema, no skin discoloration or tenderness.  No palpable cords. LYMPH NODES: No palpable cervical, supraclavicular, axillary or inguinal adenopathy  NEUROLOGICAL: Unremarkable. PSYCH:  Appropriate.   Appointment on 08/25/2017  Component Date Value Ref Range Status  . WBC 08/25/2017 3.6  3.6 -  11.0 K/uL Final  . RBC 08/25/2017 3.22* 3.80 - 5.20 MIL/uL Final  . Hemoglobin 08/25/2017 11.0* 12.0 - 16.0 g/dL Final  . HCT 08/25/2017 33.0* 35.0 - 47.0 % Final  . MCV 08/25/2017 102.5* 80.0 - 100.0 fL Final  . MCH 08/25/2017 34.1* 26.0 - 34.0 pg Final  . MCHC 08/25/2017 33.2  32.0 - 36.0 g/dL Final  . RDW 08/25/2017 14.4  11.5 - 14.5 % Final  . Platelets 08/25/2017 199  150 - 440 K/uL Final  . Neutrophils Relative % 08/25/2017 44  % Final  . Neutro Abs 08/25/2017 1.6  1.4 - 6.5 K/uL Final  . Lymphocytes Relative 08/25/2017 34  % Final  . Lymphs Abs 08/25/2017 1.2  1.0 - 3.6 K/uL Final  . Monocytes Relative 08/25/2017 10  % Final  . Monocytes Absolute 08/25/2017 0.4  0.2 - 0.9 K/uL Final  . Eosinophils Relative 08/25/2017 6  % Final  . Eosinophils Absolute 08/25/2017 0.2  0 - 0.7 K/uL Final  . Basophils Relative 08/25/2017 6  % Final  . Basophils Absolute 08/25/2017 0.2* 0 - 0.1 K/uL Final   Performed at [facility], [address], [place], [place] [address]    Assessment:  Angelica Whitney is a 77 y.o. female with mild macrocytic anemia and  leukopenia.  She has sarcoidosis, Sjogren's syndrome, rheumatoid arthritis, and Raynaud's disease.  Baseline weight is in the low 100s.  Diet is poor.  Work-up on 05/12/2017 revealed a hematocrit 33.0, hemoglobin 11.1, MCV 100.2, platelets 208,000, white count 3400 with an ANC of 1600.  Differential was unremarkable.   Normal studies included:  SPEP (polyclonal gammopathy), free light ratio, B12, folate, TSH, LDH, uric acid.  Ferritin was 110 with a saturation of 21% a TIBC of 283.  Reticulocyte count was 1%  Sed rate was 119 (elevated).  Copper was normal on 06/23/2017.  Screening colonoscopy on 04/29/2017 revealed diverticulosis in the sigmoid colon and in the ascending colon.  Bilateral screening mammogram on 07/22/2016 revealed a left breast asymmetry.  Diagnostic left mammogram on 08/24/2016 revealed no evidence of malignancy.  She watches what she eats secondary to her Sjogren's. She has xerostomia that has improved with the use of Salivamax.  She is working with nutrition.  Exam reveals arthritis changes and no adenopathy or hepatosplenomegaly.  Plan: 1.  Labs today: CBC with diff. 2.  Patient speak with nutritionist prior to leaving today.  3.  Continue Salivamax to help with xerostomia. 4.  RTC in 3 months for labs (CBC with diff) and nurse's weight check. 5.  RTC in 6 months for MD assessment and labs (CBC with diff, BMP).   Lequita Asal, MD  08/25/2017, 3:35 PM

## 2017-08-29 ENCOUNTER — Ambulatory Visit
Admission: RE | Admit: 2017-08-29 | Discharge: 2017-08-29 | Disposition: A | Discharge status: Home / Self Care | Payer: PPO | Source: Ambulatory Visit | Attending: Family Medicine | Admitting: Family Medicine

## 2017-08-29 DIAGNOSIS — Z1231 Encounter for screening mammogram for malignant neoplasm of breast: Secondary | ICD-10-CM | POA: Diagnosis not present

## 2017-09-01 DIAGNOSIS — Z79899 Other long term (current) drug therapy: Secondary | ICD-10-CM | POA: Diagnosis not present

## 2017-09-01 DIAGNOSIS — M0579 Rheumatoid arthritis with rheumatoid factor of multiple sites without organ or systems involvement: Secondary | ICD-10-CM | POA: Diagnosis not present

## 2017-09-14 DIAGNOSIS — M2011 Hallux valgus (acquired), right foot: Secondary | ICD-10-CM | POA: Diagnosis not present

## 2017-09-14 DIAGNOSIS — M79675 Pain in left toe(s): Secondary | ICD-10-CM | POA: Diagnosis not present

## 2017-09-14 DIAGNOSIS — M2012 Hallux valgus (acquired), left foot: Secondary | ICD-10-CM | POA: Diagnosis not present

## 2017-09-14 DIAGNOSIS — B351 Tinea unguium: Secondary | ICD-10-CM | POA: Diagnosis not present

## 2017-09-14 DIAGNOSIS — M2042 Other hammer toe(s) (acquired), left foot: Secondary | ICD-10-CM | POA: Diagnosis not present

## 2017-09-14 DIAGNOSIS — Q6689 Other  specified congenital deformities of feet: Secondary | ICD-10-CM | POA: Diagnosis not present

## 2017-09-14 DIAGNOSIS — M2041 Other hammer toe(s) (acquired), right foot: Secondary | ICD-10-CM | POA: Diagnosis not present

## 2017-09-14 DIAGNOSIS — M7751 Other enthesopathy of right foot: Secondary | ICD-10-CM | POA: Diagnosis not present

## 2017-09-14 DIAGNOSIS — M79674 Pain in right toe(s): Secondary | ICD-10-CM | POA: Diagnosis not present

## 2017-09-15 DIAGNOSIS — Z79899 Other long term (current) drug therapy: Secondary | ICD-10-CM | POA: Diagnosis not present

## 2017-09-15 DIAGNOSIS — M3500 Sicca syndrome, unspecified: Secondary | ICD-10-CM | POA: Diagnosis not present

## 2017-09-15 DIAGNOSIS — M19049 Primary osteoarthritis, unspecified hand: Secondary | ICD-10-CM | POA: Diagnosis not present

## 2017-09-15 DIAGNOSIS — M0579 Rheumatoid arthritis with rheumatoid factor of multiple sites without organ or systems involvement: Secondary | ICD-10-CM | POA: Diagnosis not present

## 2017-09-15 DIAGNOSIS — I7300 Raynaud's syndrome without gangrene: Secondary | ICD-10-CM | POA: Diagnosis not present

## 2017-09-15 DIAGNOSIS — D869 Sarcoidosis, unspecified: Secondary | ICD-10-CM | POA: Diagnosis not present

## 2017-09-15 DIAGNOSIS — R05 Cough: Secondary | ICD-10-CM | POA: Diagnosis not present

## 2017-10-13 DIAGNOSIS — R05 Cough: Secondary | ICD-10-CM | POA: Diagnosis not present

## 2017-10-13 DIAGNOSIS — M059 Rheumatoid arthritis with rheumatoid factor, unspecified: Secondary | ICD-10-CM | POA: Diagnosis not present

## 2017-10-13 DIAGNOSIS — R0602 Shortness of breath: Secondary | ICD-10-CM | POA: Diagnosis not present

## 2017-10-13 DIAGNOSIS — D869 Sarcoidosis, unspecified: Secondary | ICD-10-CM | POA: Diagnosis not present

## 2017-10-14 ENCOUNTER — Other Ambulatory Visit: Payer: Self-pay | Admitting: Specialist

## 2017-10-14 DIAGNOSIS — D869 Sarcoidosis, unspecified: Secondary | ICD-10-CM

## 2017-10-14 DIAGNOSIS — R0602 Shortness of breath: Secondary | ICD-10-CM

## 2017-11-01 ENCOUNTER — Ambulatory Visit
Admission: RE | Admit: 2017-11-01 | Discharge: 2017-11-01 | Disposition: A | Discharge status: Home / Self Care | Payer: PPO | Source: Ambulatory Visit | Attending: Specialist | Admitting: Specialist

## 2017-11-01 DIAGNOSIS — I2510 Atherosclerotic heart disease of native coronary artery without angina pectoris: Secondary | ICD-10-CM | POA: Diagnosis not present

## 2017-11-01 DIAGNOSIS — E042 Nontoxic multinodular goiter: Secondary | ICD-10-CM | POA: Insufficient documentation

## 2017-11-01 DIAGNOSIS — D869 Sarcoidosis, unspecified: Secondary | ICD-10-CM | POA: Diagnosis present

## 2017-11-01 DIAGNOSIS — J439 Emphysema, unspecified: Secondary | ICD-10-CM | POA: Diagnosis not present

## 2017-11-01 DIAGNOSIS — R0602 Shortness of breath: Secondary | ICD-10-CM | POA: Diagnosis present

## 2017-11-01 DIAGNOSIS — I700 Atherosclerosis of aorta: Secondary | ICD-10-CM | POA: Insufficient documentation

## 2017-11-21 DIAGNOSIS — J84112 Idiopathic pulmonary fibrosis: Secondary | ICD-10-CM | POA: Diagnosis not present

## 2017-11-21 DIAGNOSIS — R05 Cough: Secondary | ICD-10-CM | POA: Diagnosis not present

## 2017-11-21 DIAGNOSIS — J439 Emphysema, unspecified: Secondary | ICD-10-CM | POA: Diagnosis not present

## 2017-11-21 DIAGNOSIS — R0609 Other forms of dyspnea: Secondary | ICD-10-CM | POA: Diagnosis not present

## 2017-11-22 ENCOUNTER — Inpatient Hospital Stay: Payer: PPO | Attending: Hematology and Oncology

## 2017-11-22 ENCOUNTER — Other Ambulatory Visit: Payer: Self-pay

## 2017-11-22 ENCOUNTER — Telehealth: Payer: Self-pay | Admitting: *Deleted

## 2017-11-22 DIAGNOSIS — Z79899 Other long term (current) drug therapy: Secondary | ICD-10-CM | POA: Diagnosis not present

## 2017-11-22 DIAGNOSIS — K117 Disturbances of salivary secretion: Secondary | ICD-10-CM | POA: Diagnosis not present

## 2017-11-22 DIAGNOSIS — D539 Nutritional anemia, unspecified: Secondary | ICD-10-CM | POA: Diagnosis not present

## 2017-11-22 DIAGNOSIS — D638 Anemia in other chronic diseases classified elsewhere: Secondary | ICD-10-CM

## 2017-11-22 DIAGNOSIS — Z87891 Personal history of nicotine dependence: Secondary | ICD-10-CM | POA: Insufficient documentation

## 2017-11-22 DIAGNOSIS — I7300 Raynaud's syndrome without gangrene: Secondary | ICD-10-CM | POA: Insufficient documentation

## 2017-11-22 DIAGNOSIS — M069 Rheumatoid arthritis, unspecified: Secondary | ICD-10-CM | POA: Diagnosis not present

## 2017-11-22 DIAGNOSIS — Z7982 Long term (current) use of aspirin: Secondary | ICD-10-CM | POA: Insufficient documentation

## 2017-11-22 DIAGNOSIS — D72819 Decreased white blood cell count, unspecified: Secondary | ICD-10-CM | POA: Diagnosis not present

## 2017-11-22 DIAGNOSIS — M3500 Sicca syndrome, unspecified: Secondary | ICD-10-CM | POA: Diagnosis not present

## 2017-11-22 DIAGNOSIS — K5730 Diverticulosis of large intestine without perforation or abscess without bleeding: Secondary | ICD-10-CM | POA: Diagnosis not present

## 2017-11-22 LAB — BASIC METABOLIC PANEL
Anion gap: 7 (ref 5–15)
BUN: 14 mg/dL (ref 6–20)
CO2: 25 mmol/L (ref 22–32)
Calcium: 9.1 mg/dL (ref 8.9–10.3)
Chloride: 103 mmol/L (ref 101–111)
Creatinine, Ser: 0.79 mg/dL (ref 0.44–1.00)
GFR calc Af Amer: >60 mL/min (ref >60)
GFR calc non Af Amer: >60 mL/min (ref >60)
Glucose, Bld: 104 mg/dL — ABNORMAL HIGH (ref 65–99)
Potassium: 3.9 mmol/L (ref 3.5–5.1)
Sodium: 135 mmol/L (ref 135–145)

## 2017-11-22 LAB — CBC WITH DIFFERENTIAL/PLATELET
Basophils Absolute: 0.1 10*3/uL (ref 0–0.1)
Basophils Relative: 2 %
Eosinophils Absolute: 0.1 10*3/uL (ref 0–0.7)
Eosinophils Relative: 2 %
HCT: 34.7 % — ABNORMAL LOW (ref 35.0–47.0)
Hemoglobin: 11.7 g/dL — ABNORMAL LOW (ref 12.0–16.0)
Lymphocytes Relative: 24 %
Lymphs Abs: 1.5 10*3/uL (ref 1.0–3.6)
MCH: 34.4 pg — ABNORMAL HIGH (ref 26.0–34.0)
MCHC: 33.6 g/dL (ref 32.0–36.0)
MCV: 102.2 fL — ABNORMAL HIGH (ref 80.0–100.0)
Monocytes Absolute: 0.4 10*3/uL (ref 0.2–0.9)
Monocytes Relative: 6 %
Neutro Abs: 4 10*3/uL (ref 1.4–6.5)
Neutrophils Relative %: 66 %
Platelets: 247 10*3/uL (ref 150–440)
RBC: 3.39 MIL/uL — ABNORMAL LOW (ref 3.80–5.20)
RDW: 13.7 % (ref 11.5–14.5)
WBC: 6.2 10*3/uL (ref 3.6–11.0)

## 2017-11-22 NOTE — Telephone Encounter (Signed)
Patient came in today for weight check.  Weight today 92.2.  Last recorded weigh 90.

## 2017-12-16 ENCOUNTER — Encounter (INDEPENDENT_AMBULATORY_CARE_PROVIDER_SITE_OTHER): Payer: PPO

## 2017-12-16 ENCOUNTER — Ambulatory Visit (INDEPENDENT_AMBULATORY_CARE_PROVIDER_SITE_OTHER): Payer: PPO | Admitting: Vascular Surgery

## 2018-01-18 DIAGNOSIS — R7303 Prediabetes: Secondary | ICD-10-CM | POA: Diagnosis not present

## 2018-01-18 DIAGNOSIS — E7800 Pure hypercholesterolemia, unspecified: Secondary | ICD-10-CM | POA: Diagnosis not present

## 2018-01-18 IMAGING — MG MM DIGITAL SCREENING BILAT W/ CAD
5 series · 5 of 5 positions shown · non-contrast
Comparison: Previous exam(s).

CLINICAL DATA: Screening.

EXAM:
DIGITAL SCREENING BILATERAL MAMMOGRAM WITH CAD

[R MLO (1 of 2)]
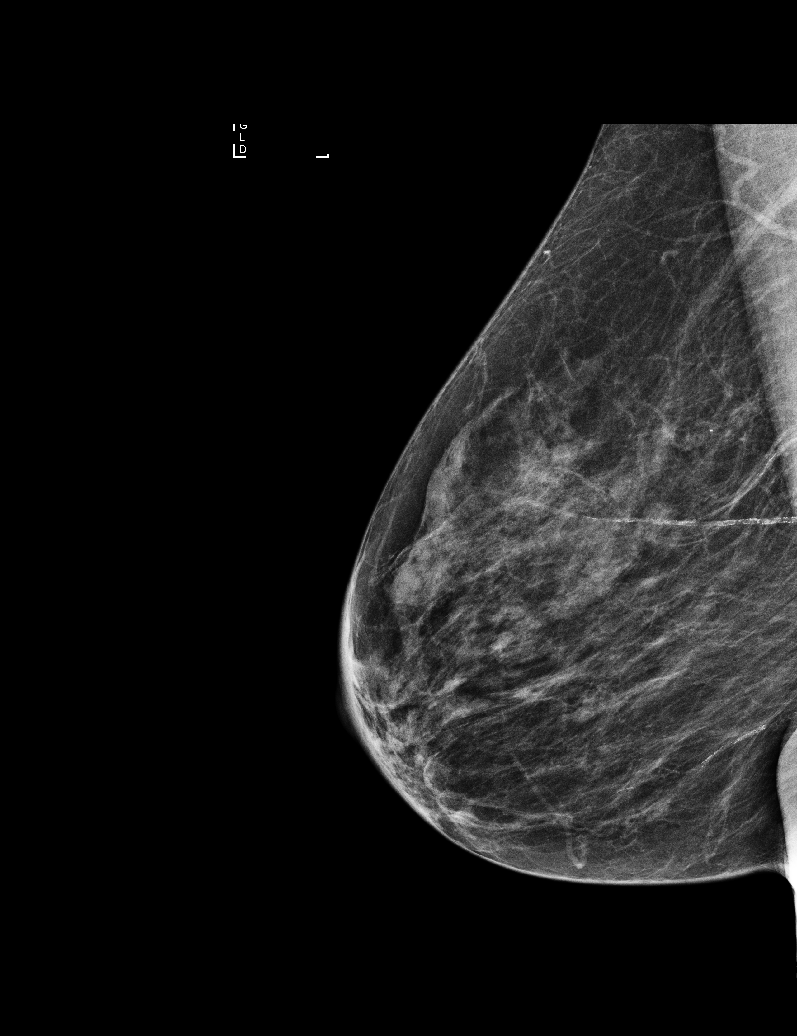

[R CC]
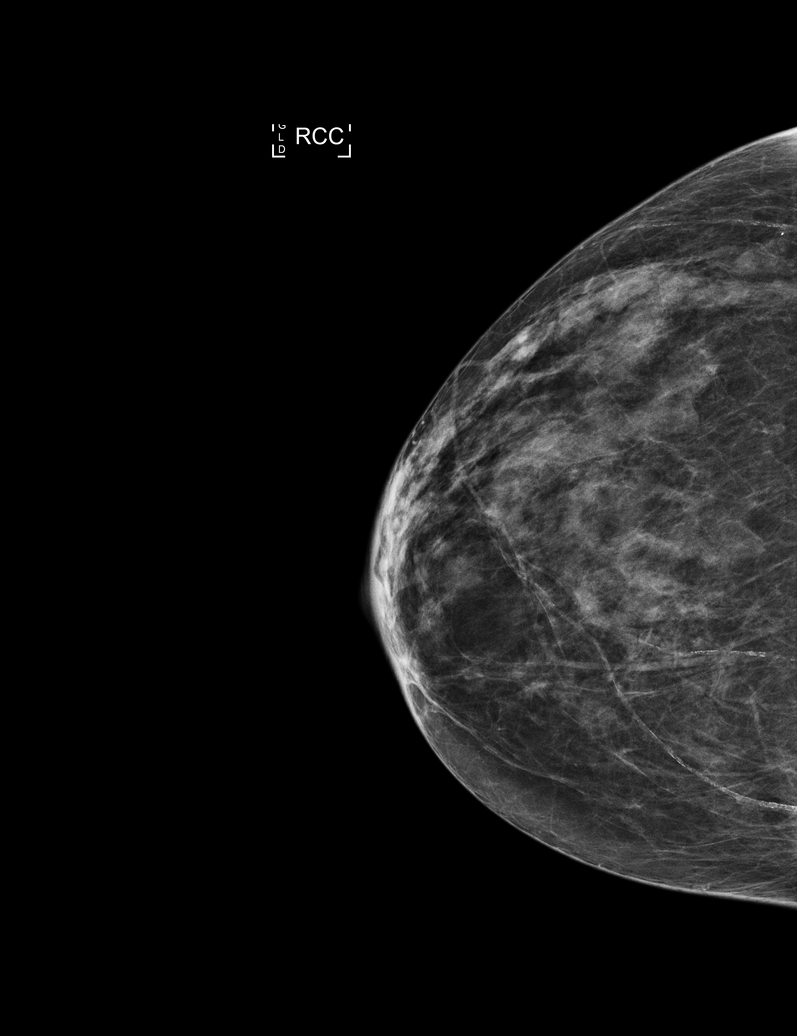

[R MLO (2 of 2)]
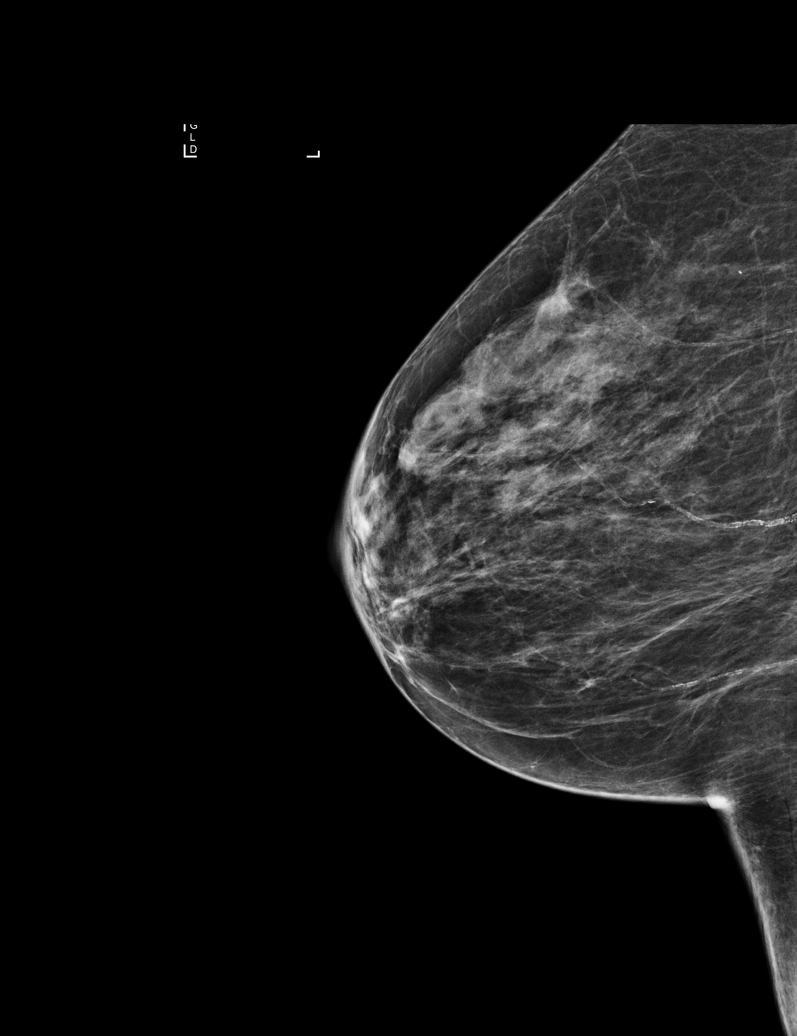

[L MLO]
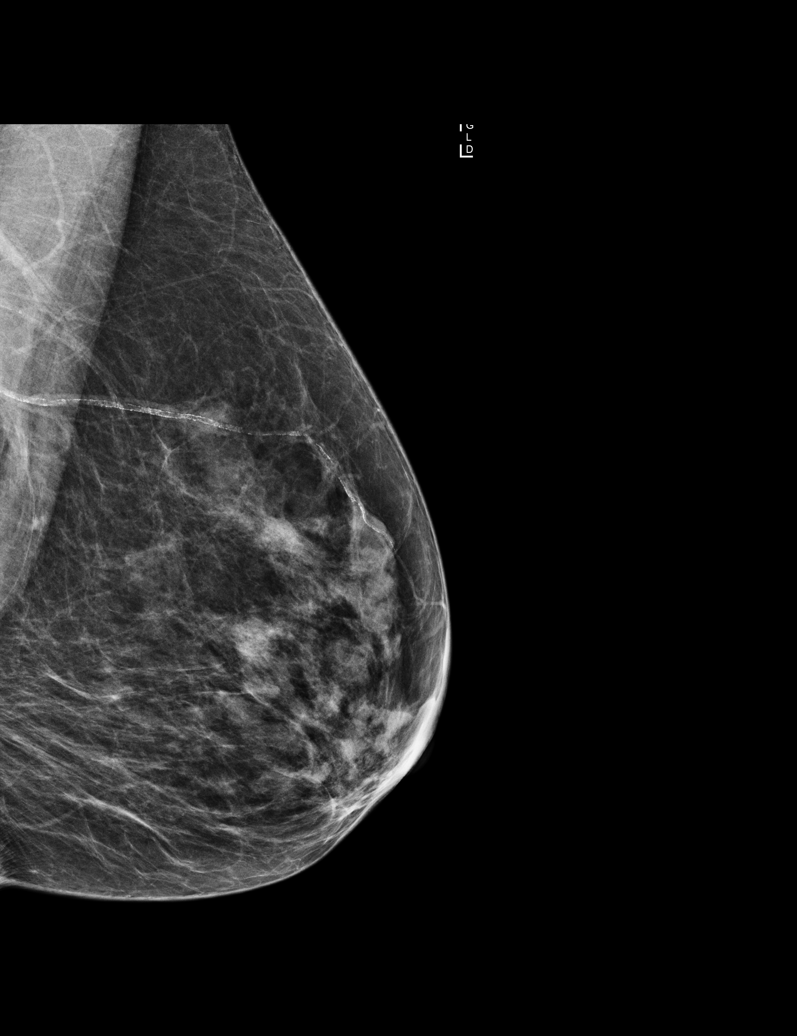

[L CC]
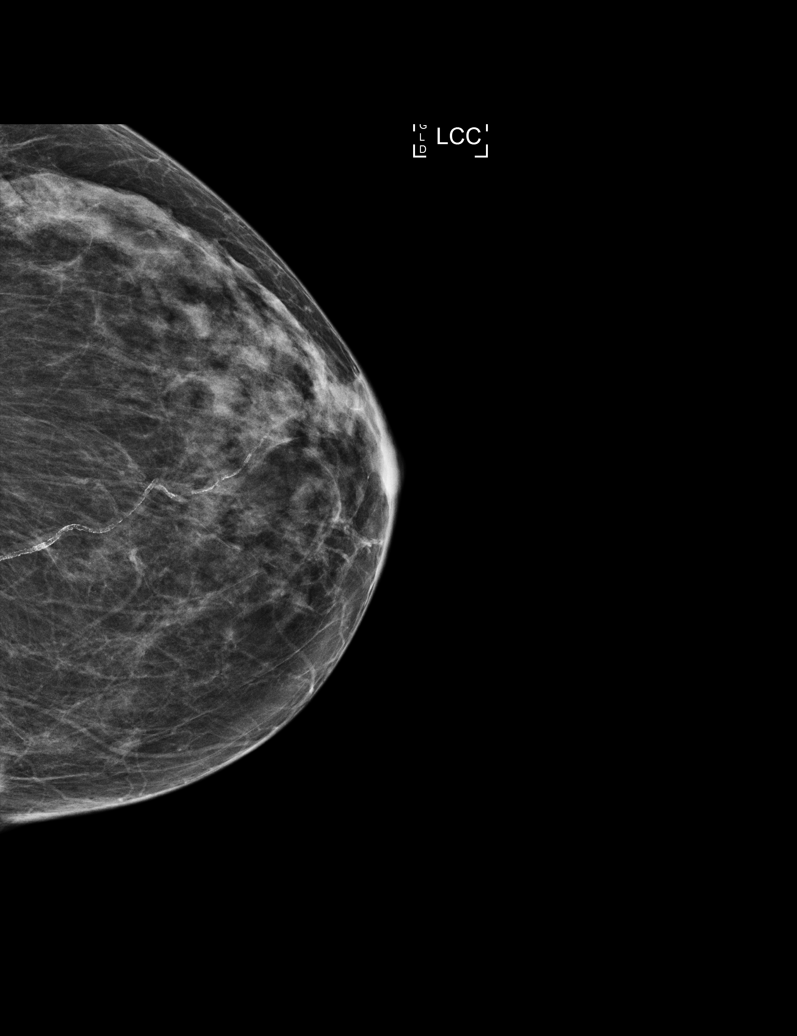

[5 of 5 positions shown; findings below may reference images not displayed]

ACR Breast Density Category c: The breast tissue is heterogeneously
dense, which may obscure small masses.
FINDINGS: In the left breast, a possible asymmetry warrants further
evaluation. In the right breast, no findings suspicious for
malignancy. Images were processed with CAD.
IMPRESSION: Further evaluation is suggested for possible asymmetry in the left
breast.

RECOMMENDATION:
Diagnostic mammogram and possibly ultrasound of the left breast.
(Code:P3-X-WW1)

The patient will be contacted regarding the findings, and additional
imaging will be scheduled.

BI-RADS CATEGORY  0: Incomplete. Need additional imaging evaluation
and/or prior mammograms for comparison.

## 2018-01-26 DIAGNOSIS — D638 Anemia in other chronic diseases classified elsewhere: Secondary | ICD-10-CM | POA: Diagnosis not present

## 2018-01-26 DIAGNOSIS — E7800 Pure hypercholesterolemia, unspecified: Secondary | ICD-10-CM | POA: Diagnosis not present

## 2018-01-26 DIAGNOSIS — K219 Gastro-esophageal reflux disease without esophagitis: Secondary | ICD-10-CM | POA: Diagnosis not present

## 2018-01-26 DIAGNOSIS — Z8639 Personal history of other endocrine, nutritional and metabolic disease: Secondary | ICD-10-CM | POA: Diagnosis not present

## 2018-01-26 DIAGNOSIS — R7303 Prediabetes: Secondary | ICD-10-CM | POA: Diagnosis not present

## 2018-02-22 ENCOUNTER — Other Ambulatory Visit: Payer: Self-pay | Admitting: *Deleted

## 2018-02-22 DIAGNOSIS — D638 Anemia in other chronic diseases classified elsewhere: Secondary | ICD-10-CM

## 2018-02-23 ENCOUNTER — Inpatient Hospital Stay: Payer: PPO | Attending: Hematology and Oncology

## 2018-02-23 ENCOUNTER — Inpatient Hospital Stay (HOSPITAL_BASED_OUTPATIENT_CLINIC_OR_DEPARTMENT_OTHER): Payer: PPO | Admitting: Hematology and Oncology

## 2018-02-23 ENCOUNTER — Other Ambulatory Visit: Payer: Self-pay

## 2018-02-23 ENCOUNTER — Other Ambulatory Visit: Payer: Self-pay | Admitting: Hematology and Oncology

## 2018-02-23 ENCOUNTER — Encounter: Payer: Self-pay | Admitting: Hematology and Oncology

## 2018-02-23 VITALS — BP 122/67 | HR 76 | Temp 98.9°F | Resp 18 | Wt 92.2 lb

## 2018-02-23 DIAGNOSIS — K5730 Diverticulosis of large intestine without perforation or abscess without bleeding: Secondary | ICD-10-CM | POA: Insufficient documentation

## 2018-02-23 DIAGNOSIS — Z7982 Long term (current) use of aspirin: Secondary | ICD-10-CM | POA: Insufficient documentation

## 2018-02-23 DIAGNOSIS — M3500 Sicca syndrome, unspecified: Secondary | ICD-10-CM

## 2018-02-23 DIAGNOSIS — D638 Anemia in other chronic diseases classified elsewhere: Secondary | ICD-10-CM

## 2018-02-23 DIAGNOSIS — D72819 Decreased white blood cell count, unspecified: Secondary | ICD-10-CM | POA: Diagnosis not present

## 2018-02-23 DIAGNOSIS — Z87891 Personal history of nicotine dependence: Secondary | ICD-10-CM | POA: Diagnosis not present

## 2018-02-23 DIAGNOSIS — K117 Disturbances of salivary secretion: Secondary | ICD-10-CM | POA: Insufficient documentation

## 2018-02-23 DIAGNOSIS — I7300 Raynaud's syndrome without gangrene: Secondary | ICD-10-CM | POA: Diagnosis not present

## 2018-02-23 DIAGNOSIS — M069 Rheumatoid arthritis, unspecified: Secondary | ICD-10-CM | POA: Diagnosis not present

## 2018-02-23 DIAGNOSIS — Z79899 Other long term (current) drug therapy: Secondary | ICD-10-CM

## 2018-02-23 DIAGNOSIS — D539 Nutritional anemia, unspecified: Secondary | ICD-10-CM | POA: Diagnosis not present

## 2018-02-23 DIAGNOSIS — M359 Systemic involvement of connective tissue, unspecified: Secondary | ICD-10-CM

## 2018-02-23 LAB — CBC WITH DIFFERENTIAL/PLATELET
Basophils Absolute: 0.1 10*3/uL (ref 0–0.1)
Basophils Relative: 2 %
Eosinophils Absolute: 0.1 10*3/uL (ref 0–0.7)
Eosinophils Relative: 4 %
HCT: 35.2 % (ref 35.0–47.0)
Hemoglobin: 11.9 g/dL — ABNORMAL LOW (ref 12.0–16.0)
Lymphocytes Relative: 45 %
Lymphs Abs: 1.9 10*3/uL (ref 1.0–3.6)
MCH: 34.5 pg — ABNORMAL HIGH (ref 26.0–34.0)
MCHC: 33.6 g/dL (ref 32.0–36.0)
MCV: 102.7 fL — ABNORMAL HIGH (ref 80.0–100.0)
Monocytes Absolute: 0.4 10*3/uL (ref 0.2–0.9)
Monocytes Relative: 11 %
Neutro Abs: 1.6 10*3/uL (ref 1.4–6.5)
Neutrophils Relative %: 38 %
Platelets: 198 10*3/uL (ref 150–440)
RBC: 3.43 MIL/uL — ABNORMAL LOW (ref 3.80–5.20)
RDW: 13.5 % (ref 11.5–14.5)
WBC: 4.1 10*3/uL (ref 3.6–11.0)

## 2018-02-23 LAB — BASIC METABOLIC PANEL
Anion gap: 8 (ref 5–15)
BUN: 11 mg/dL (ref 8–23)
CO2: 28 mmol/L (ref 22–32)
Calcium: 9.4 mg/dL (ref 8.9–10.3)
Chloride: 99 mmol/L (ref 98–111)
Creatinine, Ser: 0.86 mg/dL (ref 0.44–1.00)
GFR calc Af Amer: >60 mL/min (ref >60)
GFR calc non Af Amer: >60 mL/min (ref >60)
Glucose, Bld: 72 mg/dL (ref 70–99)
Potassium: 3.9 mmol/L (ref 3.5–5.1)
Sodium: 135 mmol/L (ref 135–145)

## 2018-02-23 LAB — VITAMIN B12: Vitamin B-12: 681 pg/mL (ref 180–914)

## 2018-02-23 LAB — FOLATE: Folate: 51.6 ng/mL (ref >5.9)

## 2018-02-23 NOTE — Progress Notes (Signed)
[facility] day:  02/23/2018   Chief Complaint: Angelica Whitney is a 77 y.o. female with mild macrocytic anemia and leukopenia who is seen for 6 month assessment.   HPI:  The patient was last seen in the medical oncology clinic on 08/25/2017.  At that time, weight was stable.  She commented that she watches what she eats secondary to her Sjogren's. Xerostomia improved with the use of Salivamax.  She was working with nutrition.  Exam revealed arthritis changes and no adenopathy or hepatosplenomegaly.  CBC revealed a hematocrit of 33.0, hemoglobin 11.0, MCV 102.5, platelets 199,000, WBC 3600 with an ANC of 1600.  Digital screening mammogram on 08/29/2017 revealed no evidence of malignancy.  Chest CT without contrast on 11/01/2017 revealed a spectrum of findings compatible with basilar predominant fibrotic interstitial lung disease with moderate honeycombing, with interval progression since 2015 chest CT, considered diagnostic of usual interstitial pneumonia (UIP).  There was three-vessel coronary atherosclerosis, stable mild multinodular goiter, aortic atherosclerosis, and emphysema   CBC on 11/22/2017 revealed a hematocrit of 34.7, hemoglobin 11.7, MCV 102.2, platelets 247,000, WBC 6200 with an ANC of 4000.  During the interim, patient is doing well overall.  She has had chronic shortness of breath "for life". She is followed by pulmonary medicine Raul Del, MD). Patient denies that she has experienced any B symptoms. She denies any interval infections.  Patient continues to have Xerostomia related to Sjogren's. She notes that the Salivamax helps so much, however due to cost, she cannot afford to use it on a consistent basis.   Patient is eating "better". She was prescribed an antihistamine, which caused her to "eat like a pig". Weight today is 92 lb 4 oz (41.8 kg), which compared to her last visit to the clinic, represents a 2 pound increase. Patient notes  that she is in the process of "getting her teeth fixed", which she believes will help with her ability to eat.     Patient denies pain in the clinic today.   Past Medical History:  Diagnosis Date  . Allergy   . Arthritis   . Cataract   . Heart murmur   . Rheumatic aortic insufficiency    rheumatic fever as a child  . Sjogren's syndrome [facility])     Past Surgical History:  Procedure Laterality Date  . ABDOMINAL HYSTERECTOMY    . BREAST BIOPSY Left 2007   benign  . COLONOSCOPY WITH PROPOFOL N/A 04/29/2017   Procedure: COLONOSCOPY WITH PROPOFOL;  Surgeon: Lollie Sails, MD;  Location: [facility] ENDOSCOPY;  Service: Endoscopy;  Laterality: N/A;  . GANGLION CYST EXCISION Right 08/06/2016   Procedure: REMOVAL GANGLION CYST FOOT;  Surgeon: Sharlotte Alamo, DPM;  Location: [facility] ORS;  Service: Podiatry;  Laterality: Right;    Family History  Problem Relation Age of Onset  . Breast cancer Sister 32  . Cancer Sister   . Breast cancer Maternal Aunt 48  . Cancer Maternal Aunt     Social History:  reports that she quit smoking about 32 years ago. She has never used smokeless tobacco. She reports that she does not drink alcohol or use drugs. Patient is a former 1 pack per day smoker; stopped in 1987. She also used to drink socially when she was younger. She is retired Publishing rights manager. Patient denies exposure to radiation and toxins.  She lives in [place].  The patient is alone today.  Allergies:  Allergies  Allergen Reactions  .  Caffeine Other (See Comments)    Kept her awake  . Cevimeline Other (See Comments)    "Disabled her"  . Lactase   . Leflunomide Other (See Comments)    Other reaction(s): Dizziness Migraines as well.  . Levofloxacin Other (See Comments)    "Disabled her"   . Codeine Nausea And Vomiting and Anxiety  . Ibuprofen Other (See Comments)    880m tablet only- caused her to not be able to move "every joint, every muscle froze up" States she can take 4  200 mg tabs without problems. 8032mtablet only- caused her to not be able to move "every joint, every muscle froze up" States she can take 4 200 mg tabs without problems.     Current Medications: Current Outpatient Medications  Medication Sig Dispense Refill  . Artificial Saliva (SALIVAMAX) PACK Dissolve 1 packet in 1 oz of water and rinse by mouth 4-8 times per day  2  . Ascorbic Acid (VITAMIN C) 1000 MG tablet Take 1,000 mg by mouth daily.    . Marland Kitchenspirin 81 MG chewable tablet Chew 81 mg by mouth daily.    . bisacodyl (DULCOLAX) 5 MG EC tablet Take by mouth.    . Calcium Carbonate-Vitamin D 600-400 MG-UNIT tablet Take 2 tablets by mouth daily.    . Marland Kitchenbuprofen (ADVIL,MOTRIN) 200 MG tablet Take 400 mg by mouth every 6 (six) hours as needed for mild pain.    . Marland Kitcheneflunomide (ARAVA) 20 MG tablet Take 20 mg by mouth daily.  11  . Liniments (SALONPAS PAIN RELIEF PATCH EX) Apply 1 patch topically daily as needed.    . Multiple Vitamins-Minerals (MULTIVITAMIN WITH MINERALS) tablet Take 1 tablet by mouth daily.    . prednisoLONE acetate (PRED FORTE) 1 % ophthalmic suspension INSTILL 1 DROP TO LEFT EYE 4 TIMES DAILY X1WK, 1 DROP TWICE DAILY X 1WK, 1 DROP DAILY X1WK, STOP  0  . HYDROcodone-acetaminophen (NORCO) 5-325 MG tablet Take 1 tablet by mouth every 4 (four) hours as needed for moderate pain. (Patient not taking: Reported on 08/25/2017) 30 tablet 0   No current facility-administered medications for this visit.     Review of Systems:  GENERAL:  Feels "ok".  No fevers, sweats or weight loss.  Weight up 2 pounds. PERFORMANCE STATUS (ECOG):  1-2 HEENT:  Dry eyes and mouth.  Working on getting teeth fixed.  No visual changes, runny nose, sore throat, mouth sores or tenderness. Lungs: No shortness of breath or cough.  No hemoptysis. Cardiac:  No chest pain, palpitations, orthopnea, or PND. GI:  No nausea, vomiting, diarrhea, constipation, melena or hematochezia. GU:  No urgency, frequency, dysuria, or  hematuria. Musculoskeletal:  No back pain.  Polyarthralgia due to rheumatoid arthritis.  No muscle tenderness. Extremities:  No pain or swelling. Skin:  No rashes or skin changes. Neuro:  No headache, numbness or weakness, balance or coordination issues. Endocrine:  No diabetes, thyroid issues, hot flashes or night sweats. Psych:  No mood changes, depression or anxiety. Pain:  No pain. Review of systems:  All other systems reviewed and found to be negative.   Physical Exam: Blood pressure 122/67, pulse 76, temperature 98.9 F (37.2 C), temperature source Tympanic, resp. rate 18, weight 92 lb 4 oz (41.8 kg), SpO2 93 %. GENERAL:  Thin woman sitting comfortably in the exam room in no acute distress. MENTAL STATUS:  Alert and oriented to person, place and time. HEAD:  Short styled dark hair.  Normocephalic, atraumatic, face symmetric, no Cushingoid  features. EYES:  Brown eyes.  Pupils equal round and reactive to light and accomodation.  No conjunctivitis or scleral icterus. ENT:  Oropharynx clear without lesion.  Tongue normal. Mucous membranes moist.  RESPIRATORY:  Bilateral dry lower lobe crackles (stable).  Clear to auscultation withoutwheezes or rhonchi. CARDIOVASCULAR:  Regular rate and rhythm without murmur, rub or gallop. ABDOMEN:  Soft, non-tender, with active bowel sounds, and no hepatosplenomegaly.  No masses. SKIN:  No rashes, ulcers or lesions. EXTREMITIES: Arthritic changes in hands.  Left wrist deviated.  No edema, no skin discoloration or tenderness.  No palpable cords. LYMPH NODES: No palpable cervical, supraclavicular, axillary or inguinal adenopathy  NEUROLOGICAL: Unremarkable. PSYCH:  Appropriate.    Orders Only on 02/23/2018  Component Date Value Ref Range Status  . Sodium 02/23/2018 135  135 - 145 mmol/L Final  . Potassium 02/23/2018 3.9  3.5 - 5.1 mmol/L Final  . Chloride 02/23/2018 99  98 - 111 mmol/L Final  . CO2 02/23/2018 28  22 - 32 mmol/L Final  . Glucose,  Bld 02/23/2018 72  70 - 99 mg/dL Final  . BUN 02/23/2018 11  8 - 23 mg/dL Final  . Creatinine, Ser 02/23/2018 0.86  0.44 - 1.00 mg/dL Final  . Calcium 02/23/2018 9.4  8.9 - 10.3 mg/dL Final  . GFR calc non Af Amer 02/23/2018 >60  >60 mL/min Final  . GFR calc Af Amer 02/23/2018 >60  >60 mL/min Final   Comment: (NOTE) The eGFR has been calculated using the CKD EPI equation. This calculation has not been validated in all clinical situations. eGFR's persistently <60 mL/min signify possible Chronic Kidney Disease.   Georgiann Hahn gap 02/23/2018 8  5 - 15 Final   Performed at [facility], [address] [place], [place] [address]  . WBC 02/23/2018 4.1  3.6 - 11.0 K/uL Final  . RBC 02/23/2018 3.43* 3.80 - 5.20 MIL/uL Final  . Hemoglobin 02/23/2018 11.9* 12.0 - 16.0 g/dL Final  . HCT 02/23/2018 35.2  35.0 - 47.0 % Final  . MCV 02/23/2018 102.7* 80.0 - 100.0 fL Final  . MCH 02/23/2018 34.5* 26.0 - 34.0 pg Final  . MCHC 02/23/2018 33.6  32.0 - 36.0 g/dL Final  . RDW 02/23/2018 13.5  11.5 - 14.5 % Final  . Platelets 02/23/2018 198  150 - 440 K/uL Final  . Neutrophils Relative % 02/23/2018 38  % Final  . Neutro Abs 02/23/2018 1.6  1.4 - 6.5 K/uL Final  . Lymphocytes Relative 02/23/2018 45  % Final  . Lymphs Abs 02/23/2018 1.9  1.0 - 3.6 K/uL Final  . Monocytes Relative 02/23/2018 11  % Final  . Monocytes Absolute 02/23/2018 0.4  0.2 - 0.9 K/uL Final  . Eosinophils Relative 02/23/2018 4  % Final  . Eosinophils Absolute 02/23/2018 0.1  0 - 0.7 K/uL Final  . Basophils Relative 02/23/2018 2  % Final  . Basophils Absolute 02/23/2018 0.1  0 - 0.1 K/uL Final   Performed at [facility], [address] [place], [place] [address]    Assessment:  NALIAH EDDINGTON is a 77 y.o. female with mild macrocytic anemia and leukopenia.  She has sarcoidosis, Sjogren's syndrome, rheumatoid arthritis, and Raynaud's disease.  Baseline weight is in the low 100s.  Diet is poor.  Work-up on 05/12/2017  revealed a hematocrit 33.0, hemoglobin 11.1, MCV 100.2, platelets 208,000, white count 3400 with an ANC of 1600.  Differential was unremarkable.   Normal studies included:  SPEP (polyclonal gammopathy),  free light ratio, B12, folate, TSH, LDH, uric acid.  Ferritin was 110 with a saturation of 21% a TIBC of 283.  Reticulocyte count was 1%  Sed rate was 119 (elevated).  Copper was normal on 06/23/2017.  Screening colonoscopy on 04/29/2017 revealed diverticulosis in the sigmoid colon and in the ascending colon.    Bilateral screening mammogram on 07/22/2016 revealed a left breast asymmetry.  Diagnostic left mammogram on 08/24/2016 revealed no evidence of malignancy.  Digital screening mammogram on 08/29/2017 revealed no evidence of malignancy.  Chest CT without contrast on 11/01/2017 revealed a spectrum of findings compatible with basilar predominant fibrotic interstitial lung disease with moderate honeycombing, with interval progression since 2015 chest CT, considered diagnostic of usual interstitial pneumonia (UIP).  There was three-vessel coronary atherosclerosis, stable mild multinodular goiter, aortic atherosclerosis, and emphysema   Symptomatically, she denies any new complaints.  Weight is up 2 pounds.  She has xerostomia.  Exam reveals arthritis changes and no adenopathy or hepatosplenomegaly.  Hemoglobin is 11.9.  Plan: 1.  Labs today: CBC with diff, BMP, vitamin B12, folate.  2.  Macrocytic anemia: mild  Hemoglobin 11.9, hematocrit 35.2.  Check B12 and folate today.  TSH was normal on 05/12/2018.    Continue to monitor TSH annually. 3.  Leukopenia: resolved  WBC 4100 with ANC 1600.  4.  Discuss mammogram results- no evidence of malignancy. 3.  Discuss interval chest CT.  Patient followed by Dr. Raul Del. 4.  RTC PRN for any concerns.  Addendum:  B12 and folate were normal today.   Honor Loh, NP  02/23/2018, 11:11 AM   I saw and evaluated the patient, participating in the key  portions of the service and reviewing pertinent diagnostic studies and records.  I reviewed the nurse practitioner's note and agree with the findings and the plan.  The assessment and plan were discussed with the patient.  A few questions were asked by the patient and answered.   Nolon Stalls, MD 02/23/2018,11:11 AM

## 2018-02-23 NOTE — Progress Notes (Signed)
Patient offers no complaints today. 

## 2018-03-21 ENCOUNTER — Ambulatory Visit (INDEPENDENT_AMBULATORY_CARE_PROVIDER_SITE_OTHER): Payer: PPO

## 2018-03-21 ENCOUNTER — Encounter (INDEPENDENT_AMBULATORY_CARE_PROVIDER_SITE_OTHER): Payer: Self-pay | Admitting: Vascular Surgery

## 2018-03-21 ENCOUNTER — Ambulatory Visit (INDEPENDENT_AMBULATORY_CARE_PROVIDER_SITE_OTHER): Payer: PPO | Admitting: Vascular Surgery

## 2018-03-21 VITALS — BP 121/74 | HR 76 | Resp 12 | Ht <= 58 in | Wt 91.0 lb

## 2018-03-21 DIAGNOSIS — I739 Peripheral vascular disease, unspecified: Secondary | ICD-10-CM

## 2018-03-21 DIAGNOSIS — R7303 Prediabetes: Secondary | ICD-10-CM

## 2018-03-21 DIAGNOSIS — I7300 Raynaud's syndrome without gangrene: Secondary | ICD-10-CM

## 2018-03-21 NOTE — Assessment & Plan Note (Signed)
blood glucose control important in reducing the progression of atherosclerotic disease. Also, involved in wound healing.   

## 2018-03-21 NOTE — Patient Instructions (Signed)

## 2018-03-21 NOTE — Assessment & Plan Note (Signed)
ABIs today are stable at 0.78 on the right and 1.0 on the left.  No limb threatening symptoms.  Continue to walk and increase her activity as tolerated.  Continue to wear compression stockings for relief of pain and swelling in the legs.  Recheck ABIs in 1 year.

## 2018-03-21 NOTE — Assessment & Plan Note (Signed)
Not overly bothersome.  With wearing her compression stockings she really does not notice this much.  No need for intervention or medication at this point.

## 2018-03-21 NOTE — Progress Notes (Signed)
MRN : [id number]  Angelica Whitney is a 77 y.o. (07/18/1941) female who presents with chief complaint of  Chief Complaint  Patient presents with  . Follow-up    6 month ABI and Arterial follow up  .  History of Present Illness: Patient returns today in follow up of PAD.  She is doing reasonably well.  She has been wearing compression stockings on her legs and her legs actually feel pretty good.  She is not having much in the way of pain or swelling.  She has some mild claudication symptoms with activity but nothing that is lifestyle limiting.  No rest pain or ulceration.  ABIs today are stable at 0.78 on the right and 1.03 on the left with biphasic waveforms.  Current Outpatient Medications  Medication Sig Dispense Refill  . Artificial Saliva (SALIVAMAX) PACK Dissolve 1 packet in 1 oz of water and rinse by mouth 4-8 times per day  2  . Ascorbic Acid (VITAMIN C) 1000 MG tablet Take 1,000 mg by mouth daily.    Marland Kitchen aspirin 81 MG chewable tablet Chew 81 mg by mouth daily.    . bisacodyl (DULCOLAX) 5 MG EC tablet Take by mouth.    . Calcium Carbonate-Vitamin D 600-400 MG-UNIT tablet Take 2 tablets by mouth daily.    Marland Kitchen HYDROcodone-acetaminophen (NORCO) 5-325 MG tablet Take 1 tablet by mouth every 4 (four) hours as needed for moderate pain. (Patient not taking: Reported on 08/25/2017) 30 tablet 0  . ibuprofen (ADVIL,MOTRIN) 200 MG tablet Take 400 mg by mouth every 6 (six) hours as needed for mild pain.    Marland Kitchen leflunomide (ARAVA) 20 MG tablet Take 20 mg by mouth daily.  11  . Liniments (SALONPAS PAIN RELIEF PATCH EX) Apply 1 patch topically daily as needed.    . Multiple Vitamins-Minerals (MULTIVITAMIN WITH MINERALS) tablet Take 1 tablet by mouth daily.    . prednisoLONE acetate (PRED FORTE) 1 % ophthalmic suspension INSTILL 1 DROP TO LEFT EYE 4 TIMES DAILY X1WK, 1 DROP TWICE DAILY X 1WK, 1 DROP DAILY X1WK, STOP  0   No current facility-administered medications for this visit.     Past Medical  History:  Diagnosis Date  . Allergy   . Arthritis   . Cataract   . Heart murmur   . Rheumatic aortic insufficiency    rheumatic fever as a child  . Sjogren's syndrome [facility])     Past Surgical History:  Procedure Laterality Date  . ABDOMINAL HYSTERECTOMY    . BREAST BIOPSY Left 2007   benign  . COLONOSCOPY WITH PROPOFOL N/A 04/29/2017   Procedure: COLONOSCOPY WITH PROPOFOL;  Surgeon: Lollie Sails, MD;  Location: [facility] ENDOSCOPY;  Service: Endoscopy;  Laterality: N/A;  . GANGLION CYST EXCISION Right 08/06/2016   Procedure: REMOVAL GANGLION CYST FOOT;  Surgeon: Sharlotte Alamo, DPM;  Location: [facility] ORS;  Service: Podiatry;  Laterality: Right;    Social History Social History   Tobacco Use  . Smoking status: Former Smoker    Last attempt to quit: 07/27/1985    Years since quitting: 32.6  . Smokeless tobacco: Never Used  Substance Use Topics  . Alcohol use: No  . Drug use: No     Family History Family History  Problem Relation Age of Onset  . Breast cancer Sister 24  . Cancer Sister   . Breast cancer Maternal Aunt 27  . Cancer Maternal Aunt      Allergies  Allergen Reactions  . Caffeine Other (  See Comments)    Kept her awake  . Cevimeline Other (See Comments)    "Disabled her"  . Lactase   . Leflunomide Other (See Comments)    Other reaction(s): Dizziness Migraines as well.  . Levofloxacin Other (See Comments)    "Disabled her"   . Codeine Nausea And Vomiting and Anxiety  . Ibuprofen Other (See Comments)    824m tablet only- caused her to not be able to move "every joint, every muscle froze up" States she can take 4 200 mg tabs without problems. 8066mtablet only- caused her to not be able to move "every joint, every muscle froze up" States she can take 4 200 mg tabs without problems.      REVIEW OF SYSTEMS (Negative unless checked)  Constitutional: [] Weight loss  [] Fever  [] Chills Cardiac: [] Chest pain   [] Chest pressure   [] Palpitations   [] Shortness of  breath when laying flat   [] Shortness of breath at rest   [] Shortness of breath with exertion. Vascular:  [] Pain in legs with walking   [] Pain in legs at rest   [] Pain in legs when laying flat   [] Claudication   [x] Pain in feet when walking  [] Pain in feet at rest  [] Pain in feet when laying flat   [] History of DVT   [] Phlebitis   [] Swelling in legs   [] Varicose veins   [] Non-healing ulcers Pulmonary:   [] Uses home oxygen   [] Productive cough   [] Hemoptysis   [] Wheeze  [] COPD   [] Asthma Neurologic:  [] Dizziness  [] Blackouts   [] Seizures   [] History of stroke   [] History of TIA  [] Aphasia   [] Temporary blindness   [] Dysphagia   [] Weakness or numbness in arms   [x] Weakness or numbness in legs Musculoskeletal:  [x] Arthritis   [] Joint swelling   [] Joint pain   [x] Low back pain Hematologic:  [] Easy bruising  [] Easy bleeding   [] Hypercoagulable state   [] Anemic   Gastrointestinal:  [] Blood in stool   [] Vomiting blood  [] Gastroesophageal reflux/heartburn   [] Abdominal pain Genitourinary:  [] Chronic kidney disease   [] Difficult urination  [] Frequent urination  [] Burning with urination   [] Hematuria Skin:  [] Rashes   [] Ulcers   [] Wounds Psychological:  [] History of anxiety   []  History of major depression.  Physical Examination  BP 121/74 (BP Location: Right Arm, Patient Position: Sitting)   Pulse 76   Resp 12   Ht 4' 9"  (1.448 m)   Wt 91 lb (41.3 kg)   BMI 19.69 kg/m  Gen:  Thin, NAD, appears younger than stated age. Head: [place]/AT Ear/Nose/Throat: Hearing grossly intact, nares w/o erythema or drainage Eyes: Conjunctiva clear. Sclera non-icteric Neck: Supple.  Trachea midline Pulmonary:  Good air movement, no use of accessory muscles.  Cardiac: RRR, no JVD Vascular:  Vessel Right Left  Radial Palpable Palpable                          PT 1+ Palpable 1+ Palpable  DP 1+ Palpable Palpable    Musculoskeletal: M/S 5/5 throughout.  No deformity or atrophy. No edema. Neurologic: Sensation  grossly intact in extremities.  Symmetrical.  Speech is fluent.  Psychiatric: Judgment intact, Mood & affect appropriate for pt's clinical situation. Dermatologic: No rashes or ulcers noted.  No cellulitis or open wounds.       Labs Recent Results (from the past 2160 hour(s))  Vitamin B12     Status: None   Collection Time: 02/23/18 10:12 AM  Result Value Ref Range   Vitamin B-12 681 180 - 914 pg/mL    Comment: (NOTE) This assay is not validated for testing neonatal or myeloproliferative syndrome specimens for Vitamin B12 levels. Performed at [facility], [place] [address] [place], [place] [address]   Folate     Status: None   Collection Time: 02/23/18 10:12 AM  Result Value Ref Range   Folate 51.6 >5.9 ng/mL    Comment: RESULT CONFIRMED BY MANUAL DILUTION. JML Performed at [facility], [address] [place], [place] [address]   Basic metabolic panel     Status: None   Collection Time: 02/23/18 10:12 AM  Result Value Ref Range   Sodium 135 135 - 145 mmol/L   Potassium 3.9 3.5 - 5.1 mmol/L   Chloride 99 98 - 111 mmol/L   CO2 28 22 - 32 mmol/L   Glucose, Bld 72 70 - 99 mg/dL   BUN 11 8 - 23 mg/dL   Creatinine, Ser 0.86 0.44 - 1.00 mg/dL   Calcium 9.4 8.9 - 10.3 mg/dL   GFR calc non Af Amer >60 >60 mL/min   GFR calc Af Amer >60 >60 mL/min    Comment: (NOTE) The eGFR has been calculated using the CKD EPI equation. This calculation has not been validated in all clinical situations. eGFR's persistently <60 mL/min signify possible Chronic Kidney Disease.    Anion gap 8 5 - 15    Comment: Performed at [facility], [address] [place], [place] [address]  CBC with Differential     Status: Abnormal   Collection Time: 02/23/18 10:12 AM  Result Value Ref Range   WBC 4.1 3.6 - 11.0 K/uL   RBC 3.43 (L) 3.80 - 5.20 MIL/uL   Hemoglobin 11.9 (L) 12.0 - 16.0 g/dL   HCT 35.2 35.0 - 47.0 %   MCV 102.7 (H) 80.0 - 100.0 fL   MCH 34.5 (H) 26.0 - 34.0 pg     MCHC 33.6 32.0 - 36.0 g/dL   RDW 13.5 11.5 - 14.5 %   Platelets 198 150 - 440 K/uL   Neutrophils Relative % 38 %   Neutro Abs 1.6 1.4 - 6.5 K/uL   Lymphocytes Relative 45 %   Lymphs Abs 1.9 1.0 - 3.6 K/uL   Monocytes Relative 11 %   Monocytes Absolute 0.4 0.2 - 0.9 K/uL   Eosinophils Relative 4 %   Eosinophils Absolute 0.1 0 - 0.7 K/uL   Basophils Relative 2 %   Basophils Absolute 0.1 0 - 0.1 K/uL    Comment: Performed at [facility], [address], [place], [place] [address]    Radiology No results found.  Assessment/Plan  Borderline diabetes blood glucose control important in reducing the progression of atherosclerotic disease. Also, involved in wound healing.   Raynaud's disease Not overly bothersome.  With wearing her compression stockings she really does not notice this much.  No need for intervention or medication at this point.  Peripheral artery disease (HCC) ABIs today are stable at 0.78 on the right and 1.0 on the left.  No limb threatening symptoms.  Continue to walk and increase her activity as tolerated.  Continue to wear compression stockings for relief of pain and swelling in the legs.  Recheck ABIs in 1 year.    Leotis Pain, MD  03/21/2018 3:04 PM    This note was created with Dragon medical transcription system.  Any errors from dictation are purely unintentional

## 2018-04-03 DIAGNOSIS — J208 Acute bronchitis due to other specified organisms: Secondary | ICD-10-CM | POA: Diagnosis not present

## 2018-04-03 DIAGNOSIS — J209 Acute bronchitis, unspecified: Secondary | ICD-10-CM | POA: Diagnosis not present

## 2018-04-06 DIAGNOSIS — J019 Acute sinusitis, unspecified: Secondary | ICD-10-CM | POA: Diagnosis not present

## 2018-04-13 DIAGNOSIS — R0609 Other forms of dyspnea: Secondary | ICD-10-CM | POA: Diagnosis not present

## 2018-04-13 DIAGNOSIS — I7300 Raynaud's syndrome without gangrene: Secondary | ICD-10-CM | POA: Diagnosis not present

## 2018-04-13 DIAGNOSIS — J849 Interstitial pulmonary disease, unspecified: Secondary | ICD-10-CM | POA: Diagnosis not present

## 2018-04-14 ENCOUNTER — Other Ambulatory Visit: Payer: Self-pay | Admitting: Specialist

## 2018-04-14 DIAGNOSIS — R1310 Dysphagia, unspecified: Secondary | ICD-10-CM

## 2018-04-21 DIAGNOSIS — M069 Rheumatoid arthritis, unspecified: Secondary | ICD-10-CM | POA: Diagnosis not present

## 2018-04-28 ENCOUNTER — Ambulatory Visit: Payer: PPO

## 2018-05-10 ENCOUNTER — Ambulatory Visit: Payer: PPO

## 2018-06-27 DIAGNOSIS — J8410 Pulmonary fibrosis, unspecified: Secondary | ICD-10-CM | POA: Diagnosis not present

## 2018-06-27 DIAGNOSIS — M0579 Rheumatoid arthritis with rheumatoid factor of multiple sites without organ or systems involvement: Secondary | ICD-10-CM | POA: Diagnosis not present

## 2018-06-27 DIAGNOSIS — M3500 Sicca syndrome, unspecified: Secondary | ICD-10-CM | POA: Diagnosis not present

## 2018-06-27 DIAGNOSIS — Z79899 Other long term (current) drug therapy: Secondary | ICD-10-CM | POA: Diagnosis not present

## 2018-06-27 DIAGNOSIS — I7300 Raynaud's syndrome without gangrene: Secondary | ICD-10-CM | POA: Diagnosis not present

## 2018-07-24 DIAGNOSIS — R7303 Prediabetes: Secondary | ICD-10-CM | POA: Diagnosis not present

## 2018-07-24 DIAGNOSIS — Z79899 Other long term (current) drug therapy: Secondary | ICD-10-CM | POA: Diagnosis not present

## 2018-07-24 DIAGNOSIS — E7800 Pure hypercholesterolemia, unspecified: Secondary | ICD-10-CM | POA: Diagnosis not present

## 2018-07-24 DIAGNOSIS — M0579 Rheumatoid arthritis with rheumatoid factor of multiple sites without organ or systems involvement: Secondary | ICD-10-CM | POA: Diagnosis not present

## 2018-07-24 DIAGNOSIS — D638 Anemia in other chronic diseases classified elsewhere: Secondary | ICD-10-CM | POA: Diagnosis not present

## 2018-07-24 DIAGNOSIS — E559 Vitamin D deficiency, unspecified: Secondary | ICD-10-CM | POA: Diagnosis not present

## 2018-07-31 DIAGNOSIS — Z0000 Encounter for general adult medical examination without abnormal findings: Secondary | ICD-10-CM | POA: Diagnosis not present

## 2018-07-31 DIAGNOSIS — R7303 Prediabetes: Secondary | ICD-10-CM | POA: Diagnosis not present

## 2018-07-31 DIAGNOSIS — E7800 Pure hypercholesterolemia, unspecified: Secondary | ICD-10-CM | POA: Diagnosis not present

## 2018-07-31 DIAGNOSIS — D638 Anemia in other chronic diseases classified elsewhere: Secondary | ICD-10-CM | POA: Diagnosis not present

## 2018-10-17 DIAGNOSIS — M0579 Rheumatoid arthritis with rheumatoid factor of multiple sites without organ or systems involvement: Secondary | ICD-10-CM | POA: Diagnosis not present

## 2019-01-23 DIAGNOSIS — R7303 Prediabetes: Secondary | ICD-10-CM | POA: Diagnosis not present

## 2019-01-23 DIAGNOSIS — D638 Anemia in other chronic diseases classified elsewhere: Secondary | ICD-10-CM | POA: Diagnosis not present

## 2019-01-23 DIAGNOSIS — E7800 Pure hypercholesterolemia, unspecified: Secondary | ICD-10-CM | POA: Diagnosis not present

## 2019-01-30 DIAGNOSIS — R7303 Prediabetes: Secondary | ICD-10-CM | POA: Diagnosis not present

## 2019-01-30 DIAGNOSIS — E7800 Pure hypercholesterolemia, unspecified: Secondary | ICD-10-CM | POA: Diagnosis not present

## 2019-01-30 DIAGNOSIS — Z1159 Encounter for screening for other viral diseases: Secondary | ICD-10-CM | POA: Diagnosis not present

## 2019-01-30 DIAGNOSIS — Z862 Personal history of diseases of the blood and blood-forming organs and certain disorders involving the immune mechanism: Secondary | ICD-10-CM | POA: Diagnosis not present

## 2019-03-19 DIAGNOSIS — Z7952 Long term (current) use of systemic steroids: Secondary | ICD-10-CM | POA: Diagnosis not present

## 2019-03-19 DIAGNOSIS — M6789 Other specified disorders of synovium and tendon, multiple sites: Secondary | ICD-10-CM | POA: Diagnosis not present

## 2019-03-19 DIAGNOSIS — M0579 Rheumatoid arthritis with rheumatoid factor of multiple sites without organ or systems involvement: Secondary | ICD-10-CM | POA: Diagnosis not present

## 2019-03-27 ENCOUNTER — Ambulatory Visit (INDEPENDENT_AMBULATORY_CARE_PROVIDER_SITE_OTHER): Payer: PPO | Admitting: Vascular Surgery

## 2019-03-27 ENCOUNTER — Encounter (INDEPENDENT_AMBULATORY_CARE_PROVIDER_SITE_OTHER): Payer: PPO

## 2019-03-30 DIAGNOSIS — M0579 Rheumatoid arthritis with rheumatoid factor of multiple sites without organ or systems involvement: Secondary | ICD-10-CM | POA: Diagnosis not present

## 2019-05-04 DIAGNOSIS — E119 Type 2 diabetes mellitus without complications: Secondary | ICD-10-CM | POA: Diagnosis not present

## 2019-05-04 DIAGNOSIS — H2513 Age-related nuclear cataract, bilateral: Secondary | ICD-10-CM | POA: Diagnosis not present

## 2019-05-04 DIAGNOSIS — M3500 Sicca syndrome, unspecified: Secondary | ICD-10-CM | POA: Diagnosis not present

## 2019-05-04 DIAGNOSIS — H1045 Other chronic allergic conjunctivitis: Secondary | ICD-10-CM | POA: Diagnosis not present

## 2019-05-30 DIAGNOSIS — H1045 Other chronic allergic conjunctivitis: Secondary | ICD-10-CM | POA: Diagnosis not present

## 2019-08-08 DIAGNOSIS — E7800 Pure hypercholesterolemia, unspecified: Secondary | ICD-10-CM | POA: Diagnosis not present

## 2019-08-08 DIAGNOSIS — Z1159 Encounter for screening for other viral diseases: Secondary | ICD-10-CM | POA: Diagnosis not present

## 2019-08-08 DIAGNOSIS — Z862 Personal history of diseases of the blood and blood-forming organs and certain disorders involving the immune mechanism: Secondary | ICD-10-CM | POA: Diagnosis not present

## 2019-08-08 DIAGNOSIS — R7303 Prediabetes: Secondary | ICD-10-CM | POA: Diagnosis not present

## 2019-08-24 DIAGNOSIS — R7303 Prediabetes: Secondary | ICD-10-CM | POA: Diagnosis not present

## 2019-08-24 DIAGNOSIS — M0579 Rheumatoid arthritis with rheumatoid factor of multiple sites without organ or systems involvement: Secondary | ICD-10-CM | POA: Diagnosis not present

## 2019-08-24 DIAGNOSIS — Z780 Asymptomatic menopausal state: Secondary | ICD-10-CM | POA: Diagnosis not present

## 2019-08-24 DIAGNOSIS — I739 Peripheral vascular disease, unspecified: Secondary | ICD-10-CM | POA: Diagnosis not present

## 2019-08-24 DIAGNOSIS — J8410 Pulmonary fibrosis, unspecified: Secondary | ICD-10-CM | POA: Diagnosis not present

## 2019-08-24 DIAGNOSIS — E7800 Pure hypercholesterolemia, unspecified: Secondary | ICD-10-CM | POA: Diagnosis not present

## 2019-08-24 DIAGNOSIS — Z0000 Encounter for general adult medical examination without abnormal findings: Secondary | ICD-10-CM | POA: Diagnosis not present

## 2019-08-24 DIAGNOSIS — D638 Anemia in other chronic diseases classified elsewhere: Secondary | ICD-10-CM | POA: Diagnosis not present

## 2019-08-30 DIAGNOSIS — M810 Age-related osteoporosis without current pathological fracture: Secondary | ICD-10-CM | POA: Diagnosis not present

## 2019-09-18 DIAGNOSIS — Z79899 Other long term (current) drug therapy: Secondary | ICD-10-CM | POA: Diagnosis not present

## 2019-09-18 DIAGNOSIS — M810 Age-related osteoporosis without current pathological fracture: Secondary | ICD-10-CM | POA: Diagnosis not present

## 2019-09-18 DIAGNOSIS — Z7952 Long term (current) use of systemic steroids: Secondary | ICD-10-CM | POA: Diagnosis not present

## 2019-09-18 DIAGNOSIS — M6789 Other specified disorders of synovium and tendon, multiple sites: Secondary | ICD-10-CM | POA: Diagnosis not present

## 2019-09-18 DIAGNOSIS — M0579 Rheumatoid arthritis with rheumatoid factor of multiple sites without organ or systems involvement: Secondary | ICD-10-CM | POA: Diagnosis not present

## 2019-09-26 ENCOUNTER — Telehealth: Payer: Self-pay

## 2019-09-26 DIAGNOSIS — Z227 Latent tuberculosis: Secondary | ICD-10-CM

## 2019-10-08 ENCOUNTER — Ambulatory Visit
Admission: RE | Admit: 2019-10-08 | Discharge: 2019-10-08 | Disposition: A | Discharge status: Home / Self Care | Payer: PPO | Source: Ambulatory Visit | Attending: Family Medicine | Admitting: Family Medicine

## 2019-10-08 ENCOUNTER — Ambulatory Visit (LOCAL_COMMUNITY_HEALTH_CENTER): Payer: PPO

## 2019-10-08 ENCOUNTER — Other Ambulatory Visit: Payer: Self-pay

## 2019-10-08 VITALS — Ht <= 58 in | Wt 92.0 lb

## 2019-10-08 DIAGNOSIS — R7612 Nonspecific reaction to cell mediated immunity measurement of gamma interferon antigen response without active tuberculosis: Secondary | ICD-10-CM | POA: Diagnosis not present

## 2019-10-08 DIAGNOSIS — J8410 Pulmonary fibrosis, unspecified: Secondary | ICD-10-CM | POA: Diagnosis not present

## 2019-10-12 ENCOUNTER — Other Ambulatory Visit: Payer: Self-pay | Admitting: Family Medicine

## 2019-10-12 DIAGNOSIS — Z227 Latent tuberculosis: Secondary | ICD-10-CM

## 2019-10-12 NOTE — Progress Notes (Signed)
Interview completed via phone d/t covid precautions.  HIV and other baseline labs not drawn. Referred from Dr. Ladell Pier office at [facility]. Patient has had recent +QGT. States she has always had negative TB tests. RA clinic wants to start patient on an immunosuppressive drug but patient is unsure if she will start d/t side effects. Sent patient for CXR after Dr. Alvester Morin reviewed patient records from [facility]. Richmond Campbell, RN

## 2019-10-12 NOTE — Telephone Encounter (Signed)
TC to patient. Discussed CXR without Active TB. Patient states she has decided not to take RA med suggested by Dr. Ardith Dark office at [facility] (Rheumatology). Discussed LTBI tx and Rifampin.  Patient wishes to proceed with Rifampin x 4 months. Orders sent to [facility].  TB RN attempted to reach Dr. Ladell Pier office for updated labs but no answer. Richmond Campbell, RN

## 2019-10-12 NOTE — Progress Notes (Signed)
Tuberculosis treatment orders  All patients are to be monitored per [place] and county TB policies.   Angelica Whitney has latent TB. Treat for latent TB per the following:  Rifampin 450mg  daily x 4 month (patient weight 92lbs per [facility] records.   Monthly LFTs.   At Rifampin start visit please draw baseline CBC w/ diff and LFTs.  Offer HIV testing also.   Labs from [facility] on 08/08/19 indicate normal LFTs and Creatinine but Hgb 10.9.

## 2019-10-13 NOTE — Progress Notes (Signed)
Event organiser for clinical support staff: I agree with the care provided to this patient and was available for any consultation.  Reviewed records and discussed with TB RN. We will treat for latent TB.   Federico Flake, MD, MPH, ABFM [facility] Medical Director

## 2019-10-15 ENCOUNTER — Ambulatory Visit (LOCAL_COMMUNITY_HEALTH_CENTER): Payer: Self-pay

## 2019-10-15 ENCOUNTER — Other Ambulatory Visit: Payer: Self-pay

## 2019-10-15 VITALS — Ht <= 58 in | Wt 91.0 lb

## 2019-10-15 DIAGNOSIS — Z227 Latent tuberculosis: Secondary | ICD-10-CM

## 2019-10-15 MED ORDER — RIFAMPIN 150 MG PO CAPS: 150.0000 mg | ORAL_CAPSULE | Freq: Every day | ORAL | 0 refills | Status: AC

## 2019-10-15 MED ORDER — RIFAMPIN 300 MG PO CAPS: 300.0000 mg | ORAL_CAPSULE | Freq: Every day | ORAL | 0 refills | Status: AC

## 2019-10-15 NOTE — Progress Notes (Signed)
Rifampin 150mg  #30 and Rifampin 300mg  #30 dispensed per [facility] order. , RN

## 2019-10-16 LAB — CBC WITH DIFFERENTIAL/PLATELET
Basophils Absolute: 0.1 10*3/uL (ref 0.0–0.2)
Basos: 2 %
EOS (ABSOLUTE): 0.2 10*3/uL (ref 0.0–0.4)
Eos: 3 %
Hematocrit: 31.1 % — ABNORMAL LOW (ref 34.0–46.6)
Hemoglobin: 10.5 g/dL — ABNORMAL LOW (ref 11.1–15.9)
Immature Grans (Abs): 0 10*3/uL (ref 0.0–0.1)
Immature Granulocytes: 0 %
Lymphocytes Absolute: 1 10*3/uL (ref 0.7–3.1)
Lymphs: 17 %
MCH: 35.1 pg — ABNORMAL HIGH (ref 26.6–33.0)
MCHC: 33.8 g/dL (ref 31.5–35.7)
MCV: 104 fL — ABNORMAL HIGH (ref 79–97)
Monocytes Absolute: 0.4 10*3/uL (ref 0.1–0.9)
Monocytes: 6 %
Neutrophils Absolute: 4.3 10*3/uL (ref 1.4–7.0)
Neutrophils: 72 %
Platelets: 268 10*3/uL (ref 150–450)
RBC: 2.99 x10E6/uL — ABNORMAL LOW (ref 3.77–5.28)
RDW: 12.4 % (ref 11.7–15.4)
WBC: 5.9 10*3/uL (ref 3.4–10.8)

## 2019-10-16 LAB — HEPATIC FUNCTION PANEL
ALT: 11 IU/L (ref 0–32)
AST: 25 IU/L (ref 0–40)
Albumin: 3.8 g/dL (ref 3.7–4.7)
Alkaline Phosphatase: 52 IU/L (ref 39–117)
Bilirubin Total: 0.3 mg/dL (ref 0.0–1.2)
Bilirubin, Direct: 0.08 mg/dL (ref 0.00–0.40)
Total Protein: 7.8 g/dL (ref 6.0–8.5)

## 2019-11-16 ENCOUNTER — Ambulatory Visit (LOCAL_COMMUNITY_HEALTH_CENTER): Payer: Self-pay

## 2019-11-16 ENCOUNTER — Other Ambulatory Visit: Payer: Self-pay

## 2019-11-16 VITALS — Wt 89.5 lb

## 2019-11-16 DIAGNOSIS — R7612 Nonspecific reaction to cell mediated immunity measurement of gamma interferon antigen response without active tuberculosis: Secondary | ICD-10-CM

## 2019-11-16 MED ORDER — RIFAMPIN 300 MG PO CAPS
300.0000 mg | ORAL_CAPSULE | Freq: Every day | ORAL | 0 refills | Status: DC
Start: 2019-11-16 — End: 2019-12-14

## 2019-11-16 MED ORDER — RIFAMPIN 150 MG PO CAPS: 150.0000 mg | ORAL_CAPSULE | Freq: Every day | ORAL | 0 refills | Status: DC

## 2019-11-17 LAB — HEPATIC FUNCTION PANEL
ALT: 19 IU/L (ref 0–32)
AST: 32 IU/L (ref 0–40)
Albumin: 3.8 g/dL (ref 3.7–4.7)
Alkaline Phosphatase: 54 IU/L (ref 39–117)
Bilirubin Total: <0.2 mg/dL (ref 0.0–1.2)
Bilirubin, Direct: 0.08 mg/dL (ref 0.00–0.40)
Total Protein: 7.7 g/dL (ref 6.0–8.5)

## 2019-11-19 NOTE — Progress Notes (Signed)
Rif 300mg  (#30) and Rif 150mg  (#30) dispensed per [facility] order. , RN

## 2019-12-13 ENCOUNTER — Ambulatory Visit (LOCAL_COMMUNITY_HEALTH_CENTER): Payer: Self-pay

## 2019-12-13 ENCOUNTER — Other Ambulatory Visit: Payer: Self-pay

## 2019-12-13 VITALS — Wt 88.0 lb

## 2019-12-13 DIAGNOSIS — R7612 Nonspecific reaction to cell mediated immunity measurement of gamma interferon antigen response without active tuberculosis: Secondary | ICD-10-CM

## 2019-12-13 MED ORDER — RIFAMPIN 150 MG PO CAPS
150.0000 mg | ORAL_CAPSULE | Freq: Every day | ORAL | 0 refills | Status: AC
Start: 2019-12-13 — End: 2020-01-12

## 2019-12-13 MED ORDER — RIFAMPIN 300 MG PO CAPS
600.0000 mg | ORAL_CAPSULE | Freq: Every day | ORAL | 0 refills | Status: DC
Start: 2019-12-13 — End: 2019-12-14

## 2019-12-14 DIAGNOSIS — H1045 Other chronic allergic conjunctivitis: Secondary | ICD-10-CM | POA: Diagnosis not present

## 2019-12-14 LAB — HEPATIC FUNCTION PANEL
ALT: 14 IU/L (ref 0–32)
AST: 26 IU/L (ref 0–40)
Albumin: 3.7 g/dL (ref 3.7–4.7)
Alkaline Phosphatase: 65 IU/L (ref 48–121)
Bilirubin Total: 0.4 mg/dL (ref 0.0–1.2)
Bilirubin, Direct: 0.13 mg/dL (ref 0.00–0.40)
Total Protein: 8.1 g/dL (ref 6.0–8.5)

## 2019-12-14 MED ORDER — RIFAMPIN 300 MG PO CAPS
300.0000 mg | ORAL_CAPSULE | Freq: Every day | ORAL | 0 refills | Status: AC
Start: 2019-12-14 — End: 2020-01-13

## 2019-12-19 DIAGNOSIS — Z7952 Long term (current) use of systemic steroids: Secondary | ICD-10-CM | POA: Diagnosis not present

## 2019-12-19 DIAGNOSIS — M810 Age-related osteoporosis without current pathological fracture: Secondary | ICD-10-CM | POA: Diagnosis not present

## 2019-12-19 DIAGNOSIS — M0579 Rheumatoid arthritis with rheumatoid factor of multiple sites without organ or systems involvement: Secondary | ICD-10-CM | POA: Diagnosis not present

## 2019-12-19 DIAGNOSIS — J8410 Pulmonary fibrosis, unspecified: Secondary | ICD-10-CM | POA: Diagnosis not present

## 2019-12-20 NOTE — Progress Notes (Signed)
#  30 Rifampin 300mg  and #30 Rifampin 150mg  disp per [facility] order. Patient will RTC in 1 month for last TBM appt. , RN

## 2020-01-15 ENCOUNTER — Ambulatory Visit (LOCAL_COMMUNITY_HEALTH_CENTER): Payer: PPO

## 2020-01-15 ENCOUNTER — Other Ambulatory Visit: Payer: Self-pay

## 2020-01-15 VITALS — Wt 89.0 lb

## 2020-01-15 DIAGNOSIS — Z227 Latent tuberculosis: Secondary | ICD-10-CM

## 2020-01-15 MED ORDER — RIFAMPIN 300 MG PO CAPS
600.0000 mg | ORAL_CAPSULE | Freq: Every day | ORAL | 0 refills | Status: AC
Start: 2020-01-15 — End: 2020-02-14

## 2020-01-15 MED ORDER — RIFAMPIN 150 MG PO CAPS
150.0000 mg | ORAL_CAPSULE | Freq: Every day | ORAL | 0 refills | Status: AC
Start: 2020-01-15 — End: 2020-02-14

## 2020-01-16 LAB — HEPATIC FUNCTION PANEL
ALT: 21 IU/L (ref 0–32)
AST: 33 IU/L (ref 0–40)
Albumin: 3.9 g/dL (ref 3.7–4.7)
Alkaline Phosphatase: 73 IU/L (ref 48–121)
Bilirubin Total: 0.6 mg/dL (ref 0.0–1.2)
Bilirubin, Direct: 0.18 mg/dL (ref 0.00–0.40)
Total Protein: 8.6 g/dL — ABNORMAL HIGH (ref 6.0–8.5)

## 2020-01-16 NOTE — Progress Notes (Signed)
Rifampin 300mg  #60 dispensed per [facility] order.  TB RN will send end of tx paperwork to PCP. [facility], RN

## 2020-01-30 DIAGNOSIS — H1045 Other chronic allergic conjunctivitis: Secondary | ICD-10-CM | POA: Diagnosis not present

## 2020-01-30 DIAGNOSIS — H2513 Age-related nuclear cataract, bilateral: Secondary | ICD-10-CM | POA: Diagnosis not present

## 2020-01-30 DIAGNOSIS — M3500 Sicca syndrome, unspecified: Secondary | ICD-10-CM | POA: Diagnosis not present

## 2020-01-30 DIAGNOSIS — E119 Type 2 diabetes mellitus without complications: Secondary | ICD-10-CM | POA: Diagnosis not present

## 2020-02-14 DIAGNOSIS — R7303 Prediabetes: Secondary | ICD-10-CM | POA: Diagnosis not present

## 2020-02-14 DIAGNOSIS — E7800 Pure hypercholesterolemia, unspecified: Secondary | ICD-10-CM | POA: Diagnosis not present

## 2020-02-14 DIAGNOSIS — D638 Anemia in other chronic diseases classified elsewhere: Secondary | ICD-10-CM | POA: Diagnosis not present

## 2020-02-29 DIAGNOSIS — E7800 Pure hypercholesterolemia, unspecified: Secondary | ICD-10-CM | POA: Diagnosis not present

## 2020-02-29 DIAGNOSIS — D638 Anemia in other chronic diseases classified elsewhere: Secondary | ICD-10-CM | POA: Diagnosis not present

## 2020-02-29 DIAGNOSIS — R7303 Prediabetes: Secondary | ICD-10-CM | POA: Diagnosis not present

## 2020-03-31 DIAGNOSIS — M2042 Other hammer toe(s) (acquired), left foot: Secondary | ICD-10-CM | POA: Diagnosis not present

## 2020-03-31 DIAGNOSIS — M2011 Hallux valgus (acquired), right foot: Secondary | ICD-10-CM | POA: Diagnosis not present

## 2020-03-31 DIAGNOSIS — M778 Other enthesopathies, not elsewhere classified: Secondary | ICD-10-CM | POA: Diagnosis not present

## 2020-03-31 DIAGNOSIS — M65871 Other synovitis and tenosynovitis, right ankle and foot: Secondary | ICD-10-CM | POA: Diagnosis not present

## 2020-03-31 DIAGNOSIS — M2041 Other hammer toe(s) (acquired), right foot: Secondary | ICD-10-CM | POA: Diagnosis not present

## 2020-03-31 DIAGNOSIS — B351 Tinea unguium: Secondary | ICD-10-CM | POA: Diagnosis not present

## 2020-03-31 DIAGNOSIS — M2012 Hallux valgus (acquired), left foot: Secondary | ICD-10-CM | POA: Diagnosis not present

## 2020-03-31 DIAGNOSIS — M65872 Other synovitis and tenosynovitis, left ankle and foot: Secondary | ICD-10-CM | POA: Diagnosis not present

## 2020-04-02 DIAGNOSIS — H2512 Age-related nuclear cataract, left eye: Secondary | ICD-10-CM | POA: Diagnosis not present

## 2020-04-02 DIAGNOSIS — H2513 Age-related nuclear cataract, bilateral: Secondary | ICD-10-CM | POA: Diagnosis not present

## 2020-04-02 DIAGNOSIS — H25013 Cortical age-related cataract, bilateral: Secondary | ICD-10-CM | POA: Diagnosis not present

## 2020-04-02 DIAGNOSIS — H25042 Posterior subcapsular polar age-related cataract, left eye: Secondary | ICD-10-CM | POA: Diagnosis not present

## 2020-04-02 DIAGNOSIS — E119 Type 2 diabetes mellitus without complications: Secondary | ICD-10-CM | POA: Diagnosis not present

## 2020-05-08 DIAGNOSIS — M069 Rheumatoid arthritis, unspecified: Secondary | ICD-10-CM | POA: Diagnosis not present

## 2020-05-13 DIAGNOSIS — H25812 Combined forms of age-related cataract, left eye: Secondary | ICD-10-CM | POA: Diagnosis not present

## 2020-05-13 DIAGNOSIS — H2512 Age-related nuclear cataract, left eye: Secondary | ICD-10-CM | POA: Diagnosis not present

## 2020-05-20 DIAGNOSIS — H2512 Age-related nuclear cataract, left eye: Secondary | ICD-10-CM | POA: Diagnosis not present

## 2020-06-04 DIAGNOSIS — H2511 Age-related nuclear cataract, right eye: Secondary | ICD-10-CM | POA: Diagnosis not present

## 2020-06-04 DIAGNOSIS — H25011 Cortical age-related cataract, right eye: Secondary | ICD-10-CM | POA: Diagnosis not present

## 2020-06-17 DIAGNOSIS — H2511 Age-related nuclear cataract, right eye: Secondary | ICD-10-CM | POA: Diagnosis not present

## 2020-06-17 DIAGNOSIS — H25811 Combined forms of age-related cataract, right eye: Secondary | ICD-10-CM | POA: Diagnosis not present

## 2020-06-17 DIAGNOSIS — H25011 Cortical age-related cataract, right eye: Secondary | ICD-10-CM | POA: Diagnosis not present

## 2020-06-23 DIAGNOSIS — D638 Anemia in other chronic diseases classified elsewhere: Secondary | ICD-10-CM | POA: Diagnosis not present

## 2020-06-23 DIAGNOSIS — R7303 Prediabetes: Secondary | ICD-10-CM | POA: Diagnosis not present

## 2020-06-23 DIAGNOSIS — E7800 Pure hypercholesterolemia, unspecified: Secondary | ICD-10-CM | POA: Diagnosis not present

## 2020-06-24 DIAGNOSIS — H2511 Age-related nuclear cataract, right eye: Secondary | ICD-10-CM | POA: Diagnosis not present

## 2020-06-30 DIAGNOSIS — D638 Anemia in other chronic diseases classified elsewhere: Secondary | ICD-10-CM | POA: Diagnosis not present

## 2020-06-30 DIAGNOSIS — E7800 Pure hypercholesterolemia, unspecified: Secondary | ICD-10-CM | POA: Diagnosis not present

## 2020-06-30 DIAGNOSIS — R7303 Prediabetes: Secondary | ICD-10-CM | POA: Diagnosis not present

## 2020-08-01 DIAGNOSIS — J8410 Pulmonary fibrosis, unspecified: Secondary | ICD-10-CM | POA: Diagnosis not present

## 2020-08-01 DIAGNOSIS — R6889 Other general symptoms and signs: Secondary | ICD-10-CM | POA: Diagnosis not present

## 2020-08-07 DIAGNOSIS — R0602 Shortness of breath: Secondary | ICD-10-CM | POA: Diagnosis not present

## 2020-08-07 DIAGNOSIS — Z7189 Other specified counseling: Secondary | ICD-10-CM | POA: Diagnosis not present

## 2020-08-07 DIAGNOSIS — U071 COVID-19: Secondary | ICD-10-CM | POA: Diagnosis not present

## 2020-09-01 DIAGNOSIS — U071 COVID-19: Secondary | ICD-10-CM | POA: Diagnosis not present

## 2020-09-01 DIAGNOSIS — J1282 Pneumonia due to coronavirus disease 2019: Secondary | ICD-10-CM | POA: Diagnosis not present

## 2020-09-01 DIAGNOSIS — J189 Pneumonia, unspecified organism: Secondary | ICD-10-CM | POA: Diagnosis not present

## 2020-09-01 DIAGNOSIS — J8410 Pulmonary fibrosis, unspecified: Secondary | ICD-10-CM | POA: Diagnosis not present

## 2020-09-23 DIAGNOSIS — J8410 Pulmonary fibrosis, unspecified: Secondary | ICD-10-CM | POA: Diagnosis not present

## 2020-09-24 DIAGNOSIS — J8410 Pulmonary fibrosis, unspecified: Secondary | ICD-10-CM | POA: Diagnosis not present

## 2020-10-09 DIAGNOSIS — J8410 Pulmonary fibrosis, unspecified: Secondary | ICD-10-CM | POA: Diagnosis not present

## 2020-10-09 DIAGNOSIS — M3500 Sicca syndrome, unspecified: Secondary | ICD-10-CM | POA: Diagnosis not present

## 2020-10-09 DIAGNOSIS — M810 Age-related osteoporosis without current pathological fracture: Secondary | ICD-10-CM | POA: Diagnosis not present

## 2020-10-09 DIAGNOSIS — M0579 Rheumatoid arthritis with rheumatoid factor of multiple sites without organ or systems involvement: Secondary | ICD-10-CM | POA: Diagnosis not present

## 2020-10-09 DIAGNOSIS — Z79899 Other long term (current) drug therapy: Secondary | ICD-10-CM | POA: Diagnosis not present

## 2020-10-29 DIAGNOSIS — R7303 Prediabetes: Secondary | ICD-10-CM | POA: Diagnosis not present

## 2020-10-29 DIAGNOSIS — D638 Anemia in other chronic diseases classified elsewhere: Secondary | ICD-10-CM | POA: Diagnosis not present

## 2020-10-29 DIAGNOSIS — E7800 Pure hypercholesterolemia, unspecified: Secondary | ICD-10-CM | POA: Diagnosis not present

## 2020-11-05 DIAGNOSIS — E785 Hyperlipidemia, unspecified: Secondary | ICD-10-CM | POA: Diagnosis not present

## 2020-11-05 DIAGNOSIS — D649 Anemia, unspecified: Secondary | ICD-10-CM | POA: Diagnosis not present

## 2020-11-05 DIAGNOSIS — Z0000 Encounter for general adult medical examination without abnormal findings: Secondary | ICD-10-CM | POA: Diagnosis not present

## 2020-11-05 DIAGNOSIS — R7303 Prediabetes: Secondary | ICD-10-CM | POA: Diagnosis not present

## 2020-11-18 DIAGNOSIS — Z0100 Encounter for examination of eyes and vision without abnormal findings: Secondary | ICD-10-CM | POA: Diagnosis not present

## 2021-03-09 DIAGNOSIS — H2513 Age-related nuclear cataract, bilateral: Secondary | ICD-10-CM | POA: Diagnosis not present

## 2021-03-09 DIAGNOSIS — E119 Type 2 diabetes mellitus without complications: Secondary | ICD-10-CM | POA: Diagnosis not present

## 2021-03-09 DIAGNOSIS — H01009 Unspecified blepharitis unspecified eye, unspecified eyelid: Secondary | ICD-10-CM | POA: Diagnosis not present

## 2021-03-09 DIAGNOSIS — M3500 Sicca syndrome, unspecified: Secondary | ICD-10-CM | POA: Diagnosis not present

## 2021-03-09 DIAGNOSIS — H1045 Other chronic allergic conjunctivitis: Secondary | ICD-10-CM | POA: Diagnosis not present

## 2021-04-09 DIAGNOSIS — M0579 Rheumatoid arthritis with rheumatoid factor of multiple sites without organ or systems involvement: Secondary | ICD-10-CM | POA: Diagnosis not present

## 2021-04-09 DIAGNOSIS — J8410 Pulmonary fibrosis, unspecified: Secondary | ICD-10-CM | POA: Diagnosis not present

## 2021-04-09 DIAGNOSIS — M3500 Sicca syndrome, unspecified: Secondary | ICD-10-CM | POA: Diagnosis not present

## 2021-04-23 DIAGNOSIS — J40 Bronchitis, not specified as acute or chronic: Secondary | ICD-10-CM | POA: Diagnosis not present

## 2021-04-23 DIAGNOSIS — M0579 Rheumatoid arthritis with rheumatoid factor of multiple sites without organ or systems involvement: Secondary | ICD-10-CM | POA: Diagnosis not present

## 2021-04-23 DIAGNOSIS — Z20822 Contact with and (suspected) exposure to covid-19: Secondary | ICD-10-CM | POA: Diagnosis not present

## 2021-04-23 DIAGNOSIS — J8410 Pulmonary fibrosis, unspecified: Secondary | ICD-10-CM | POA: Diagnosis not present

## 2021-04-23 DIAGNOSIS — U071 COVID-19: Secondary | ICD-10-CM | POA: Diagnosis not present

## 2021-05-05 DIAGNOSIS — E7800 Pure hypercholesterolemia, unspecified: Secondary | ICD-10-CM | POA: Diagnosis not present

## 2021-05-05 DIAGNOSIS — D638 Anemia in other chronic diseases classified elsewhere: Secondary | ICD-10-CM | POA: Diagnosis not present

## 2021-05-05 DIAGNOSIS — R7303 Prediabetes: Secondary | ICD-10-CM | POA: Diagnosis not present

## 2021-05-07 DIAGNOSIS — R7303 Prediabetes: Secondary | ICD-10-CM | POA: Diagnosis not present

## 2021-05-07 DIAGNOSIS — D638 Anemia in other chronic diseases classified elsewhere: Secondary | ICD-10-CM | POA: Diagnosis not present

## 2021-05-07 DIAGNOSIS — E7800 Pure hypercholesterolemia, unspecified: Secondary | ICD-10-CM | POA: Diagnosis not present

## 2021-07-30 DIAGNOSIS — M25571 Pain in right ankle and joints of right foot: Secondary | ICD-10-CM | POA: Diagnosis not present

## 2021-07-30 DIAGNOSIS — J84112 Idiopathic pulmonary fibrosis: Secondary | ICD-10-CM | POA: Diagnosis not present

## 2021-07-30 DIAGNOSIS — M0579 Rheumatoid arthritis with rheumatoid factor of multiple sites without organ or systems involvement: Secondary | ICD-10-CM | POA: Diagnosis not present

## 2021-07-30 DIAGNOSIS — M3500 Sicca syndrome, unspecified: Secondary | ICD-10-CM | POA: Diagnosis not present

## 2021-08-06 ENCOUNTER — Other Ambulatory Visit: Payer: Self-pay | Admitting: Family Medicine

## 2021-08-06 DIAGNOSIS — Z1231 Encounter for screening mammogram for malignant neoplasm of breast: Secondary | ICD-10-CM

## 2021-08-21 DIAGNOSIS — Z9181 History of falling: Secondary | ICD-10-CM | POA: Diagnosis not present

## 2021-08-21 DIAGNOSIS — M1990 Unspecified osteoarthritis, unspecified site: Secondary | ICD-10-CM | POA: Diagnosis not present

## 2021-08-21 DIAGNOSIS — J8410 Pulmonary fibrosis, unspecified: Secondary | ICD-10-CM | POA: Diagnosis not present

## 2021-09-07 ENCOUNTER — Other Ambulatory Visit: Payer: Self-pay

## 2021-09-07 ENCOUNTER — Ambulatory Visit
Admission: RE | Admit: 2021-09-07 | Discharge: 2021-09-07 | Disposition: A | Discharge status: Home / Self Care | Payer: No Typology Code available for payment source | Source: Ambulatory Visit | Attending: Family Medicine | Admitting: Family Medicine

## 2021-09-07 DIAGNOSIS — D638 Anemia in other chronic diseases classified elsewhere: Secondary | ICD-10-CM | POA: Diagnosis not present

## 2021-09-07 DIAGNOSIS — I739 Peripheral vascular disease, unspecified: Secondary | ICD-10-CM | POA: Diagnosis not present

## 2021-09-07 DIAGNOSIS — R7303 Prediabetes: Secondary | ICD-10-CM | POA: Diagnosis not present

## 2021-09-07 DIAGNOSIS — Z1231 Encounter for screening mammogram for malignant neoplasm of breast: Secondary | ICD-10-CM | POA: Diagnosis not present

## 2021-09-11 DIAGNOSIS — I739 Peripheral vascular disease, unspecified: Secondary | ICD-10-CM | POA: Diagnosis not present

## 2021-09-11 DIAGNOSIS — D638 Anemia in other chronic diseases classified elsewhere: Secondary | ICD-10-CM | POA: Diagnosis not present

## 2021-09-11 DIAGNOSIS — R7303 Prediabetes: Secondary | ICD-10-CM | POA: Diagnosis not present

## 2021-09-15 DIAGNOSIS — M5030 Other cervical disc degeneration, unspecified cervical region: Secondary | ICD-10-CM | POA: Diagnosis not present

## 2021-09-29 DIAGNOSIS — M5030 Other cervical disc degeneration, unspecified cervical region: Secondary | ICD-10-CM | POA: Diagnosis not present

## 2021-09-29 DIAGNOSIS — M6281 Muscle weakness (generalized): Secondary | ICD-10-CM | POA: Diagnosis not present

## 2021-10-02 DIAGNOSIS — D638 Anemia in other chronic diseases classified elsewhere: Secondary | ICD-10-CM | POA: Diagnosis not present

## 2021-10-02 DIAGNOSIS — I739 Peripheral vascular disease, unspecified: Secondary | ICD-10-CM | POA: Diagnosis not present

## 2021-10-02 DIAGNOSIS — R7303 Prediabetes: Secondary | ICD-10-CM | POA: Diagnosis not present

## 2021-10-06 ENCOUNTER — Other Ambulatory Visit (INDEPENDENT_AMBULATORY_CARE_PROVIDER_SITE_OTHER): Payer: Self-pay

## 2021-10-06 ENCOUNTER — Other Ambulatory Visit (INDEPENDENT_AMBULATORY_CARE_PROVIDER_SITE_OTHER): Payer: Self-pay | Admitting: Nurse Practitioner

## 2021-10-06 DIAGNOSIS — I739 Peripheral vascular disease, unspecified: Secondary | ICD-10-CM

## 2021-10-07 ENCOUNTER — Encounter (INDEPENDENT_AMBULATORY_CARE_PROVIDER_SITE_OTHER): Payer: Self-pay | Admitting: Nurse Practitioner

## 2021-10-07 ENCOUNTER — Ambulatory Visit (INDEPENDENT_AMBULATORY_CARE_PROVIDER_SITE_OTHER): Payer: No Typology Code available for payment source

## 2021-10-07 ENCOUNTER — Ambulatory Visit (INDEPENDENT_AMBULATORY_CARE_PROVIDER_SITE_OTHER): Payer: No Typology Code available for payment source | Admitting: Nurse Practitioner

## 2021-10-07 ENCOUNTER — Other Ambulatory Visit: Payer: Self-pay

## 2021-10-07 VITALS — BP 128/73 | HR 83 | Resp 17 | Ht <= 58 in | Wt 83.0 lb

## 2021-10-07 DIAGNOSIS — E7800 Pure hypercholesterolemia, unspecified: Secondary | ICD-10-CM

## 2021-10-07 DIAGNOSIS — I739 Peripheral vascular disease, unspecified: Secondary | ICD-10-CM | POA: Diagnosis not present

## 2021-10-07 DIAGNOSIS — M0520 Rheumatoid vasculitis with rheumatoid arthritis of unspecified site: Secondary | ICD-10-CM | POA: Diagnosis not present

## 2021-10-11 ENCOUNTER — Encounter (INDEPENDENT_AMBULATORY_CARE_PROVIDER_SITE_OTHER): Payer: Self-pay | Admitting: Nurse Practitioner

## 2021-10-11 NOTE — Progress Notes (Signed)
? ?Subjective:  ? ? Patient ID: Angelica Whitney, female    DOB: [birth year], 81 y.o.   MRN: [id number] ?Chief Complaint  ?Patient presents with  ? Establish Care  ?  Referred by dr Netty Starring  ? ? ?Angelica Whitney is a 81 year old female that presents today for evaluation of her peripheral arterial disease.  We last saw the patient in 2019.  She notes that it began when her right ankle began to hurt and it became swollen.  This somewhat subsided after she soaked her leg in Epsom salt.  She notes that shortly after the right became better the left began to do the same thing.  Recently she had 4 weeks of prednisone given to her by her orthopedic physician and that helped the pain and the swelling that were ongoing within her ankles.  She currently denies any pain or issues in her legs or feet now.  She denies claudication-like symptoms although she has not extremely active.  She denies any rest pain laying in bed at night.  There are no open wounds or ulcerations.  She denies any discoloration of her lower extremities. ? ?Today the right ABI 0.77 with a TBI 0.58.  This is nearly identical to the previous studies in 2019.  We will have to 0.77 with a TBI of 1.11.  She has biphasic tibial artery waveforms bilaterally with slightly dampened toe waveforms on the right and normal toe waveforms on the left. ? ? ?Review of Systems  ?Musculoskeletal:  Positive for arthralgias and joint swelling.  ?All other systems reviewed and are negative. ? ?   ?Objective:  ? Physical Exam ?Vitals reviewed.  ?HENT:  ?   Head: Normocephalic.  ?Cardiovascular:  ?   Rate and Rhythm: Normal rate.  ?   Pulses:     ?     Dorsalis pedis pulses are 1+ on the right side and 1+ on the left side.  ?Pulmonary:  ?   Effort: Pulmonary effort is normal.  ?Skin: ?   General: Skin is warm and dry.  ?Neurological:  ?   Mental Status: She is alert and oriented to person, place, and time.  ?Psychiatric:     ?   Mood and Affect: Mood normal.     ?   Behavior:  Behavior normal.     ?   Thought Content: Thought content normal.     ?   Judgment: Judgment normal.  ? ? ?BP 128/73 (BP Location: Right Arm)   Pulse 83   Resp 17   Ht 4\' 10"  (1.473 m)   Wt 83 lb (37.6 kg)   BMI 17.35 kg/m?  ? ?Past Medical History:  ?Diagnosis Date  ? Allergy   ? Arthritis   ? Cataract   ? Heart murmur   ? Rheumatic aortic insufficiency   ? rheumatic fever as a child  ? Sjogren's syndrome ([place])   ? ? ?Social History  ? ?Socioeconomic History  ? Marital status: Divorced  ?  Spouse name: Not on file  ? Number of children: Not on file  ? Years of education: Not on file  ? Highest education level: Not on file  ?Occupational History  ? Not on file  ?Tobacco Use  ? Smoking status: Former  ?  Types: Cigarettes  ?  Quit date: 07/27/1985  ?  Years since quitting: 36.2  ? Smokeless tobacco: Never  ?Vaping Use  ? Vaping Use: Never used  ?Substance and Sexual Activity  ? Alcohol  use: No  ? Drug use: No  ? Sexual activity: Never  ?Other Topics Concern  ? Not on file  ?Social History Narrative  ? Not on file  ? ?Social Determinants of Health  ? ?Financial Resource Strain: Not on file  ?Food Insecurity: Not on file  ?Transportation Needs: Not on file  ?Physical Activity: Not on file  ?Stress: Not on file  ?Social Connections: Not on file  ?Intimate Partner Violence: Not on file  ? ? ?Past Surgical History:  ?Procedure Laterality Date  ? ABDOMINAL HYSTERECTOMY    ? BREAST BIOPSY Left 2007  ? benign  ? COLONOSCOPY WITH PROPOFOL N/A 04/29/2017  ? Procedure: COLONOSCOPY WITH PROPOFOL;  Surgeon: Lollie Sails, MD;  Location: [facility] ENDOSCOPY;  Service: Endoscopy;  Laterality: N/A;  ? GANGLION CYST EXCISION Right 08/06/2016  ? Procedure: REMOVAL GANGLION CYST FOOT;  Surgeon: Sharlotte Alamo, DPM;  Location: [facility] ORS;  Service: Podiatry;  Laterality: Right;  ? ? ?Family History  ?Problem Relation Age of Onset  ? Breast cancer Sister 56  ? Cancer Sister   ? Breast cancer Maternal Aunt 42  ? Cancer Maternal Aunt    ? ? ?Allergies  ?Allergen Reactions  ? Caffeine Other (See Comments)  ?  Kept her awake  ? Cevimeline Other (See Comments)  ?  "Disabled her"  ? Leflunomide Other (See Comments)  ?  Other reaction(s): Dizziness ?Migraines as well.  ? Levofloxacin Other (See Comments)  ?  "Disabled her"   ? Tilactase   ? Codeine Nausea And Vomiting and Anxiety  ? Ibuprofen Other (See Comments)  ?  800mg  tablet only- caused her to not be able to move "every joint, every muscle froze up" States she can take 4 200 mg tabs without problems. ?800mg  tablet only- caused her to not be able to move "every joint, every muscle froze up" States she can take 4 200 mg tabs without problems. ?  ? ? ? ?  Latest Ref Rng & Units 10/15/2019  ?  5:08 PM 02/23/2018  ? 10:12 AM 11/22/2017  ?  1:15 PM  ?CBC  ?WBC 3.4 - 10.8 x10E3/uL 5.9   4.1   6.2    ?Hemoglobin 11.1 - 15.9 g/dL 10.5   11.9   11.7    ?Hematocrit 34.0 - 46.6 % 31.1   35.2   34.7    ?Platelets 150 - 450 x10E3/uL 268   198   247    ? ? ? ? ?CMP  ?   ?Component Value Date/Time  ? NA 135 02/23/2018 1012  ? NA 135 (L) 03/24/2014 1350  ? K 3.9 02/23/2018 1012  ? K 3.8 03/24/2014 1350  ? CL 99 02/23/2018 1012  ? CL 104 03/24/2014 1350  ? CO2 28 02/23/2018 1012  ? CO2 26 03/24/2014 1350  ? GLUCOSE 72 02/23/2018 1012  ? GLUCOSE 100 (H) 03/24/2014 1350  ? BUN 11 02/23/2018 1012  ? BUN 9 03/24/2014 1350  ? CREATININE 0.86 02/23/2018 1012  ? CREATININE 0.88 03/24/2014 1350  ? CALCIUM 9.4 02/23/2018 1012  ? CALCIUM 8.3 (L) 03/24/2014 1350  ? PROT 8.6 (H) 01/15/2020 1620  ? ALBUMIN 3.9 01/15/2020 1620  ? AST 33 01/15/2020 1620  ? ALT 21 01/15/2020 1620  ? ALKPHOS 73 01/15/2020 1620  ? BILITOT 0.6 01/15/2020 1620  ? GFRNONAA >60 02/23/2018 1012  ? GFRNONAA >60 03/24/2014 1350  ? GFRAA >60 02/23/2018 1012  ? GFRAA >60 03/24/2014 1350  ? ? ? ?No  results found. ? ?   ?Assessment & Plan:  ? ?1. Peripheral artery disease ([place]) ?The patient does have PAD however I do not believe that this current episode of pain  was related to her PAD.  She notes that all the pain and discomfort she was having left after a week course of prednisone.  That she has had that for she denies any issues or pain.  The patient describes is also not consistent with claudication or rest pain.  We will have the patient return in 6 months in order to continue to follow her PAD. ? ?2. Rheumatoid arteritis ([place]) ?The patient does have a longstanding history of rheumatoid arthritis.  I suspect that the most recent cause of pain was related to some sort of arthritic symptoms. ? ?3. Pure hypercholesterolemia ?Continue statin as ordered and reviewed, no changes at this time  ? ? ?Current Outpatient Medications on File Prior to Visit  ?Medication Sig Dispense Refill  ? Ascorbic Acid (VITAMIN C) 1000 MG tablet Take 1,000 mg by mouth daily.    ? aspirin 81 MG chewable tablet Chew 81 mg by mouth daily.    ? bisacodyl (DULCOLAX) 5 MG EC tablet Take by mouth.    ? Calcium Carbonate-Vitamin D 600-400 MG-UNIT tablet Take 2 tablets by mouth daily.    ? Liniments (SALONPAS PAIN RELIEF PATCH EX) Apply 1 patch topically daily as needed.    ? Multiple Vitamins-Minerals (MULTIVITAMIN WITH MINERALS) tablet Take 1 tablet by mouth daily.    ? prednisoLONE acetate (PRED FORTE) 1 % ophthalmic suspension INSTILL 1 DROP TO LEFT EYE 4 TIMES DAILY X1WK, 1 DROP TWICE DAILY X 1WK, 1 DROP DAILY X1WK, STOP  0  ? Artificial Saliva (SALIVAMAX) PACK Dissolve 1 packet in 1 oz of water and rinse by mouth 4-8 times per day (Patient not taking: Reported on 10/07/2021)  2  ? HYDROcodone-acetaminophen (NORCO) 5-325 MG tablet Take 1 tablet by mouth every 4 (four) hours as needed for moderate pain. (Patient not taking: Reported on 08/25/2017) 30 tablet 0  ? predniSONE (DELTASONE) 5 MG tablet 5 mg. (Patient not taking: Reported on 10/07/2021)    ? ?No current facility-administered medications on file prior to visit.  ? ? ?There are no Patient Instructions on file for this visit. ?No follow-ups on  file. ? ? ?Kris Hartmann, NP ? ? ?

## 2021-10-13 DIAGNOSIS — M6281 Muscle weakness (generalized): Secondary | ICD-10-CM | POA: Diagnosis not present

## 2021-10-13 DIAGNOSIS — M5030 Other cervical disc degeneration, unspecified cervical region: Secondary | ICD-10-CM | POA: Diagnosis not present

## 2021-11-10 DIAGNOSIS — M5030 Other cervical disc degeneration, unspecified cervical region: Secondary | ICD-10-CM | POA: Diagnosis not present

## 2021-11-10 DIAGNOSIS — M6281 Muscle weakness (generalized): Secondary | ICD-10-CM | POA: Diagnosis not present

## 2021-11-24 DIAGNOSIS — M5030 Other cervical disc degeneration, unspecified cervical region: Secondary | ICD-10-CM | POA: Diagnosis not present

## 2021-12-30 DIAGNOSIS — E7800 Pure hypercholesterolemia, unspecified: Secondary | ICD-10-CM | POA: Diagnosis not present

## 2021-12-30 DIAGNOSIS — D638 Anemia in other chronic diseases classified elsewhere: Secondary | ICD-10-CM | POA: Diagnosis not present

## 2021-12-30 DIAGNOSIS — R7303 Prediabetes: Secondary | ICD-10-CM | POA: Diagnosis not present

## 2022-01-12 DIAGNOSIS — R7303 Prediabetes: Secondary | ICD-10-CM | POA: Diagnosis not present

## 2022-01-12 DIAGNOSIS — Z0000 Encounter for general adult medical examination without abnormal findings: Secondary | ICD-10-CM | POA: Diagnosis not present

## 2022-01-12 DIAGNOSIS — D638 Anemia in other chronic diseases classified elsewhere: Secondary | ICD-10-CM | POA: Diagnosis not present

## 2022-01-12 DIAGNOSIS — E7800 Pure hypercholesterolemia, unspecified: Secondary | ICD-10-CM | POA: Diagnosis not present

## 2022-01-13 DIAGNOSIS — M2041 Other hammer toe(s) (acquired), right foot: Secondary | ICD-10-CM | POA: Diagnosis not present

## 2022-01-13 DIAGNOSIS — M2012 Hallux valgus (acquired), left foot: Secondary | ICD-10-CM | POA: Diagnosis not present

## 2022-01-13 DIAGNOSIS — B351 Tinea unguium: Secondary | ICD-10-CM | POA: Diagnosis not present

## 2022-01-13 DIAGNOSIS — M2042 Other hammer toe(s) (acquired), left foot: Secondary | ICD-10-CM | POA: Diagnosis not present

## 2022-01-13 DIAGNOSIS — L600 Ingrowing nail: Secondary | ICD-10-CM | POA: Diagnosis not present

## 2022-01-13 DIAGNOSIS — R7303 Prediabetes: Secondary | ICD-10-CM | POA: Diagnosis not present

## 2022-01-13 DIAGNOSIS — M2011 Hallux valgus (acquired), right foot: Secondary | ICD-10-CM | POA: Diagnosis not present

## 2022-02-03 DIAGNOSIS — I739 Peripheral vascular disease, unspecified: Secondary | ICD-10-CM | POA: Diagnosis not present

## 2022-02-03 DIAGNOSIS — E7800 Pure hypercholesterolemia, unspecified: Secondary | ICD-10-CM | POA: Diagnosis not present

## 2022-02-03 DIAGNOSIS — R7303 Prediabetes: Secondary | ICD-10-CM | POA: Diagnosis not present

## 2022-03-05 DIAGNOSIS — I739 Peripheral vascular disease, unspecified: Secondary | ICD-10-CM | POA: Diagnosis not present

## 2022-03-05 DIAGNOSIS — E7800 Pure hypercholesterolemia, unspecified: Secondary | ICD-10-CM | POA: Diagnosis not present

## 2022-03-05 DIAGNOSIS — R7303 Prediabetes: Secondary | ICD-10-CM | POA: Diagnosis not present

## 2022-03-29 ENCOUNTER — Other Ambulatory Visit (INDEPENDENT_AMBULATORY_CARE_PROVIDER_SITE_OTHER): Payer: Self-pay | Admitting: Nurse Practitioner

## 2022-03-29 DIAGNOSIS — I739 Peripheral vascular disease, unspecified: Secondary | ICD-10-CM

## 2022-03-30 ENCOUNTER — Ambulatory Visit (INDEPENDENT_AMBULATORY_CARE_PROVIDER_SITE_OTHER): Payer: No Typology Code available for payment source | Admitting: Vascular Surgery

## 2022-03-30 ENCOUNTER — Encounter (INDEPENDENT_AMBULATORY_CARE_PROVIDER_SITE_OTHER): Payer: Self-pay | Admitting: Vascular Surgery

## 2022-03-30 ENCOUNTER — Ambulatory Visit (INDEPENDENT_AMBULATORY_CARE_PROVIDER_SITE_OTHER): Payer: No Typology Code available for payment source

## 2022-03-30 VITALS — BP 118/71 | HR 67 | Resp 17

## 2022-03-30 DIAGNOSIS — I739 Peripheral vascular disease, unspecified: Secondary | ICD-10-CM | POA: Diagnosis not present

## 2022-03-30 DIAGNOSIS — R7303 Prediabetes: Secondary | ICD-10-CM

## 2022-03-30 DIAGNOSIS — I7300 Raynaud's syndrome without gangrene: Secondary | ICD-10-CM

## 2022-03-30 NOTE — Assessment & Plan Note (Signed)
Her ABIs today are up on both sides to 0.88 on the right and 1.12 on the left although there may be some false elevation as she still has monophasic waveforms on the right and monophasic to biphasic waveforms on the left.  Overall doing well.  No change in medical regimen.  Recheck in 1 year.

## 2022-03-30 NOTE — Progress Notes (Signed)
MRN : [id number]  Angelica Whitney is a 81 y.o. ([birth year]) female who presents with chief complaint of No chief complaint on file. Marland Kitchen  History of Present Illness: Patient returns today in follow up of her PAD.  She seems to be doing well.  The redness and swelling in her right leg is gotten much better wants to get the rheumatoid arthritis under control.  She is walking more and feels well today.  Her ABIs today are up on both sides to 0.88 on the right and 1.12 on the left although there may be some false elevation as she still has monophasic waveforms on the right and monophasic to biphasic waveforms on the left.  Current Outpatient Medications  Medication Sig Dispense Refill   Ascorbic Acid (VITAMIN C) 1000 MG tablet Take 1,000 mg by mouth daily.     aspirin 81 MG chewable tablet Chew 81 mg by mouth daily.     bisacodyl (DULCOLAX) 5 MG EC tablet Take by mouth.     Calcium Carbonate-Vitamin D 600-400 MG-UNIT tablet Take 2 tablets by mouth daily.     Liniments (SALONPAS PAIN RELIEF PATCH EX) Apply 1 patch topically daily as needed.     Multiple Vitamins-Minerals (MULTIVITAMIN WITH MINERALS) tablet Take 1 tablet by mouth daily.     Artificial Saliva (SALIVAMAX) PACK Dissolve 1 packet in 1 oz of water and rinse by mouth 4-8 times per day (Patient not taking: Reported on 10/07/2021)  2   HYDROcodone-acetaminophen (NORCO) 5-325 MG tablet Take 1 tablet by mouth every 4 (four) hours as needed for moderate pain. (Patient not taking: Reported on 08/25/2017) 30 tablet 0   meclizine (ANTIVERT) 12.5 MG tablet Take 12.5 mg by mouth 3 (three) times daily as needed.     prednisoLONE acetate (PRED FORTE) 1 % ophthalmic suspension INSTILL 1 DROP TO LEFT EYE 4 TIMES DAILY X1WK, 1 DROP TWICE DAILY X 1WK, 1 DROP DAILY X1WK, STOP (Patient not taking: Reported on 03/30/2022)  0   predniSONE (DELTASONE) 5 MG tablet 5 mg. (Patient not taking: Reported on 10/07/2021)     No current facility-administered medications for  this visit.    Past Medical History:  Diagnosis Date   Allergy    Arthritis    Cataract    Heart murmur    Rheumatic aortic insufficiency    rheumatic fever as a child   Sjogren's syndrome [facility])     Past Surgical History:  Procedure Laterality Date   ABDOMINAL HYSTERECTOMY     BREAST BIOPSY Left 2007   benign   COLONOSCOPY WITH PROPOFOL N/A 04/29/2017   Procedure: COLONOSCOPY WITH PROPOFOL;  Surgeon: Christena Deem, MD;  Location: [facility] ENDOSCOPY;  Service: Endoscopy;  Laterality: N/A;   GANGLION CYST EXCISION Right 08/06/2016   Procedure: REMOVAL GANGLION CYST FOOT;  Surgeon: Linus Galas, DPM;  Location: [facility] ORS;  Service: Podiatry;  Laterality: Right;     Social History   Tobacco Use   Smoking status: Former    Types: Cigarettes    Quit date: 07/27/1985    Years since quitting: 36.6   Smokeless tobacco: Never  Vaping Use   Vaping Use: Never used  Substance Use Topics   Alcohol use: No   Drug use: No       Family History  Problem Relation Age of Onset   Breast cancer Sister 33   Cancer Sister    Breast cancer Maternal Aunt 45   Cancer Maternal Aunt  Allergies  Allergen Reactions   Caffeine Other (See Comments)    Kept her awake   Cevimeline Other (See Comments)    "Disabled her"   Leflunomide Other (See Comments)    Other reaction(s): Dizziness Migraines as well.   Levofloxacin Other (See Comments)    "Disabled her"    Tilactase    Codeine Nausea And Vomiting and Anxiety   Ibuprofen Other (See Comments)    800mg  tablet only- caused her to not be able to move "every joint, every muscle froze up" States she can take 4 200 mg tabs without problems. 800mg  tablet only- caused her to not be able to move "every joint, every muscle froze up" States she can take 4 200 mg tabs without problems.      REVIEW OF SYSTEMS (Negative unless checked)  Constitutional: [] Weight loss  [] Fever  [] Chills Cardiac: [] Chest pain   [] Chest pressure    [] Palpitations   [] Shortness of breath when laying flat   [] Shortness of breath at rest   [] Shortness of breath with exertion. Vascular:  [] Pain in legs with walking   [] Pain in legs at rest   [] Pain in legs when laying flat   [] Claudication   [] Pain in feet when walking  [] Pain in feet at rest  [] Pain in feet when laying flat   [] History of DVT   [] Phlebitis   [] Swelling in legs   [] Varicose veins   [] Non-healing ulcers Pulmonary:   [] Uses home oxygen   [] Productive cough   [] Hemoptysis   [] Wheeze  [] COPD   [] Asthma Neurologic:  [] Dizziness  [] Blackouts   [] Seizures   [] History of stroke   [] History of TIA  [] Aphasia   [] Temporary blindness   [] Dysphagia   [] Weakness or numbness in arms   [] Weakness or numbness in legs Musculoskeletal:  [x] Arthritis   [] Joint swelling   [x] Joint pain   [] Low back pain Hematologic:  [] Easy bruising  [] Easy bleeding   [] Hypercoagulable state   [] Anemic   Gastrointestinal:  [] Blood in stool   [] Vomiting blood  [] Gastroesophageal reflux/heartburn   [] Abdominal pain Genitourinary:  [] Chronic kidney disease   [] Difficult urination  [] Frequent urination  [] Burning with urination   [] Hematuria Skin:  [] Rashes   [] Ulcers   [] Wounds Psychological:  [] History of anxiety   []  History of major depression.  Physical Examination  BP 118/71 (BP Location: Left Arm)   Pulse 67   Resp 17  Gen:  thin and frail appearing, NAD Head: [place]/AT, No temporalis wasting. Ear/Nose/Throat: Hearing grossly intact, nares w/o erythema or drainage Eyes: Conjunctiva clear. Sclera non-icteric Neck: Supple.  Trachea midline Pulmonary:  Good air movement, no use of accessory muscles.  Cardiac: RRR, no JVD Vascular:  Vessel Right Left  Radial Palpable Palpable                          PT 1+ Palpable 2+ Palpable  DP 1+ Palpable 1+ Palpable   Gastrointestinal: soft, non-tender/non-distended. No guarding/reflex.  Musculoskeletal: M/S 5/5 throughout.  No deformity or atrophy. No  edema. Neurologic: Sensation grossly intact in extremities.  Symmetrical.  Speech is fluent.  Psychiatric: Judgment intact, Mood & affect appropriate for pt's clinical situation. Dermatologic: No rashes or ulcers noted.  No cellulitis or open wounds.      Labs No results found for this or any previous visit (from the past 2160 hour(s)).  Radiology No results found.  Assessment/Plan  Peripheral artery disease (HCC) Her ABIs today are up  on both sides to 0.88 on the right and 1.12 on the left although there may be some false elevation as she still has monophasic waveforms on the right and monophasic to biphasic waveforms on the left.  Overall doing well.  No change in medical regimen.  Recheck in 1 year.  Raynaud's disease Symptoms not currently that bothersome.  Borderline diabetes blood glucose control important in reducing the progression of atherosclerotic disease. Also, involved in wound healing. On appropriate medications.    Leotis Pain, MD  03/30/2022 2:27 PM    This note was created with Dragon medical transcription system.  Any errors from dictation are purely unintentional

## 2022-03-30 NOTE — Assessment & Plan Note (Signed)
blood glucose control important in reducing the progression of atherosclerotic disease. Also, involved in wound healing. On appropriate medications.  

## 2022-03-30 NOTE — Assessment & Plan Note (Signed)
Symptoms not currently that bothersome.

## 2022-04-06 DIAGNOSIS — F17211 Nicotine dependence, cigarettes, in remission: Secondary | ICD-10-CM | POA: Diagnosis not present

## 2022-04-06 DIAGNOSIS — R7303 Prediabetes: Secondary | ICD-10-CM | POA: Diagnosis not present

## 2022-04-06 DIAGNOSIS — I7300 Raynaud's syndrome without gangrene: Secondary | ICD-10-CM | POA: Diagnosis not present

## 2022-04-06 DIAGNOSIS — M069 Rheumatoid arthritis, unspecified: Secondary | ICD-10-CM | POA: Diagnosis not present

## 2022-04-06 DIAGNOSIS — J449 Chronic obstructive pulmonary disease, unspecified: Secondary | ICD-10-CM | POA: Diagnosis not present

## 2022-04-06 DIAGNOSIS — M3500 Sicca syndrome, unspecified: Secondary | ICD-10-CM | POA: Diagnosis not present

## 2022-04-06 DIAGNOSIS — Z008 Encounter for other general examination: Secondary | ICD-10-CM | POA: Diagnosis not present

## 2022-04-06 DIAGNOSIS — N189 Chronic kidney disease, unspecified: Secondary | ICD-10-CM | POA: Diagnosis not present

## 2022-04-06 DIAGNOSIS — Z681 Body mass index (BMI) 19 or less, adult: Secondary | ICD-10-CM | POA: Diagnosis not present

## 2022-04-06 DIAGNOSIS — R2681 Unsteadiness on feet: Secondary | ICD-10-CM | POA: Diagnosis not present

## 2022-04-06 DIAGNOSIS — I700 Atherosclerosis of aorta: Secondary | ICD-10-CM | POA: Diagnosis not present

## 2022-04-06 DIAGNOSIS — R636 Underweight: Secondary | ICD-10-CM | POA: Diagnosis not present

## 2022-04-22 DIAGNOSIS — I739 Peripheral vascular disease, unspecified: Secondary | ICD-10-CM | POA: Diagnosis not present

## 2022-04-22 DIAGNOSIS — R7303 Prediabetes: Secondary | ICD-10-CM | POA: Diagnosis not present

## 2022-04-22 DIAGNOSIS — E7800 Pure hypercholesterolemia, unspecified: Secondary | ICD-10-CM | POA: Diagnosis not present

## 2022-05-10 DIAGNOSIS — M3500 Sicca syndrome, unspecified: Secondary | ICD-10-CM | POA: Diagnosis not present

## 2022-05-10 DIAGNOSIS — M0579 Rheumatoid arthritis with rheumatoid factor of multiple sites without organ or systems involvement: Secondary | ICD-10-CM | POA: Diagnosis not present

## 2022-05-10 DIAGNOSIS — J8410 Pulmonary fibrosis, unspecified: Secondary | ICD-10-CM | POA: Diagnosis not present

## 2022-05-10 DIAGNOSIS — Z7960 Long term (current) use of unspecified immunomodulators and immunosuppressants: Secondary | ICD-10-CM | POA: Diagnosis not present

## 2022-05-13 DIAGNOSIS — M79674 Pain in right toe(s): Secondary | ICD-10-CM | POA: Diagnosis not present

## 2022-05-13 DIAGNOSIS — M79675 Pain in left toe(s): Secondary | ICD-10-CM | POA: Diagnosis not present

## 2022-05-13 DIAGNOSIS — B351 Tinea unguium: Secondary | ICD-10-CM | POA: Diagnosis not present

## 2022-05-28 DIAGNOSIS — I739 Peripheral vascular disease, unspecified: Secondary | ICD-10-CM | POA: Diagnosis not present

## 2022-05-28 DIAGNOSIS — E7800 Pure hypercholesterolemia, unspecified: Secondary | ICD-10-CM | POA: Diagnosis not present

## 2022-07-05 DIAGNOSIS — E7800 Pure hypercholesterolemia, unspecified: Secondary | ICD-10-CM | POA: Diagnosis not present

## 2022-07-05 DIAGNOSIS — I739 Peripheral vascular disease, unspecified: Secondary | ICD-10-CM | POA: Diagnosis not present

## 2022-07-07 DIAGNOSIS — R7303 Prediabetes: Secondary | ICD-10-CM | POA: Diagnosis not present

## 2022-07-07 DIAGNOSIS — E7800 Pure hypercholesterolemia, unspecified: Secondary | ICD-10-CM | POA: Diagnosis not present

## 2022-07-07 DIAGNOSIS — D638 Anemia in other chronic diseases classified elsewhere: Secondary | ICD-10-CM | POA: Diagnosis not present

## 2022-07-14 DIAGNOSIS — R636 Underweight: Secondary | ICD-10-CM | POA: Diagnosis not present

## 2022-07-14 DIAGNOSIS — K219 Gastro-esophageal reflux disease without esophagitis: Secondary | ICD-10-CM | POA: Diagnosis not present

## 2022-07-14 DIAGNOSIS — J8410 Pulmonary fibrosis, unspecified: Secondary | ICD-10-CM | POA: Diagnosis not present

## 2022-07-14 DIAGNOSIS — D638 Anemia in other chronic diseases classified elsewhere: Secondary | ICD-10-CM | POA: Diagnosis not present

## 2022-07-14 DIAGNOSIS — R7303 Prediabetes: Secondary | ICD-10-CM | POA: Diagnosis not present

## 2022-07-15 DIAGNOSIS — Z681 Body mass index (BMI) 19 or less, adult: Secondary | ICD-10-CM | POA: Diagnosis not present

## 2022-07-15 DIAGNOSIS — R7303 Prediabetes: Secondary | ICD-10-CM | POA: Diagnosis not present

## 2022-07-15 DIAGNOSIS — R636 Underweight: Secondary | ICD-10-CM | POA: Diagnosis not present

## 2022-07-15 DIAGNOSIS — Z008 Encounter for other general examination: Secondary | ICD-10-CM | POA: Diagnosis not present

## 2022-08-18 DIAGNOSIS — E7800 Pure hypercholesterolemia, unspecified: Secondary | ICD-10-CM | POA: Diagnosis not present

## 2022-08-18 DIAGNOSIS — I739 Peripheral vascular disease, unspecified: Secondary | ICD-10-CM | POA: Diagnosis not present

## 2022-09-07 DIAGNOSIS — E7800 Pure hypercholesterolemia, unspecified: Secondary | ICD-10-CM | POA: Diagnosis not present

## 2022-09-07 DIAGNOSIS — I739 Peripheral vascular disease, unspecified: Secondary | ICD-10-CM | POA: Diagnosis not present

## 2022-09-09 DIAGNOSIS — M0579 Rheumatoid arthritis with rheumatoid factor of multiple sites without organ or systems involvement: Secondary | ICD-10-CM | POA: Diagnosis not present

## 2022-09-09 DIAGNOSIS — J8410 Pulmonary fibrosis, unspecified: Secondary | ICD-10-CM | POA: Diagnosis not present

## 2022-09-17 DIAGNOSIS — B351 Tinea unguium: Secondary | ICD-10-CM | POA: Diagnosis not present

## 2022-09-17 DIAGNOSIS — M79675 Pain in left toe(s): Secondary | ICD-10-CM | POA: Diagnosis not present

## 2022-09-17 DIAGNOSIS — M79674 Pain in right toe(s): Secondary | ICD-10-CM | POA: Diagnosis not present

## 2022-09-17 DIAGNOSIS — M2012 Hallux valgus (acquired), left foot: Secondary | ICD-10-CM | POA: Diagnosis not present

## 2022-09-17 DIAGNOSIS — M2041 Other hammer toe(s) (acquired), right foot: Secondary | ICD-10-CM | POA: Diagnosis not present

## 2022-09-17 DIAGNOSIS — M2011 Hallux valgus (acquired), right foot: Secondary | ICD-10-CM | POA: Diagnosis not present

## 2022-09-17 DIAGNOSIS — R7303 Prediabetes: Secondary | ICD-10-CM | POA: Diagnosis not present

## 2022-09-17 DIAGNOSIS — M2042 Other hammer toe(s) (acquired), left foot: Secondary | ICD-10-CM | POA: Diagnosis not present

## 2022-09-17 DIAGNOSIS — L97511 Non-pressure chronic ulcer of other part of right foot limited to breakdown of skin: Secondary | ICD-10-CM | POA: Diagnosis not present

## 2022-09-17 DIAGNOSIS — L600 Ingrowing nail: Secondary | ICD-10-CM | POA: Diagnosis not present

## 2022-10-18 DIAGNOSIS — I739 Peripheral vascular disease, unspecified: Secondary | ICD-10-CM | POA: Diagnosis not present

## 2022-10-18 DIAGNOSIS — E7800 Pure hypercholesterolemia, unspecified: Secondary | ICD-10-CM | POA: Diagnosis not present

## 2023-01-10 DIAGNOSIS — D638 Anemia in other chronic diseases classified elsewhere: Secondary | ICD-10-CM | POA: Diagnosis not present

## 2023-01-10 DIAGNOSIS — R636 Underweight: Secondary | ICD-10-CM | POA: Diagnosis not present

## 2023-01-10 DIAGNOSIS — K219 Gastro-esophageal reflux disease without esophagitis: Secondary | ICD-10-CM | POA: Diagnosis not present

## 2023-01-10 DIAGNOSIS — J8410 Pulmonary fibrosis, unspecified: Secondary | ICD-10-CM | POA: Diagnosis not present

## 2023-01-10 DIAGNOSIS — R7303 Prediabetes: Secondary | ICD-10-CM | POA: Diagnosis not present

## 2023-01-11 DIAGNOSIS — Z681 Body mass index (BMI) 19 or less, adult: Secondary | ICD-10-CM | POA: Diagnosis not present

## 2023-01-11 DIAGNOSIS — K219 Gastro-esophageal reflux disease without esophagitis: Secondary | ICD-10-CM | POA: Diagnosis not present

## 2023-01-11 DIAGNOSIS — J439 Emphysema, unspecified: Secondary | ICD-10-CM | POA: Diagnosis not present

## 2023-01-11 DIAGNOSIS — I2510 Atherosclerotic heart disease of native coronary artery without angina pectoris: Secondary | ICD-10-CM | POA: Diagnosis not present

## 2023-01-11 DIAGNOSIS — N189 Chronic kidney disease, unspecified: Secondary | ICD-10-CM | POA: Diagnosis not present

## 2023-01-11 DIAGNOSIS — J8410 Pulmonary fibrosis, unspecified: Secondary | ICD-10-CM | POA: Diagnosis not present

## 2023-01-11 DIAGNOSIS — Z008 Encounter for other general examination: Secondary | ICD-10-CM | POA: Diagnosis not present

## 2023-01-11 DIAGNOSIS — I700 Atherosclerosis of aorta: Secondary | ICD-10-CM | POA: Diagnosis not present

## 2023-01-11 DIAGNOSIS — R636 Underweight: Secondary | ICD-10-CM | POA: Diagnosis not present

## 2023-01-11 DIAGNOSIS — M810 Age-related osteoporosis without current pathological fracture: Secondary | ICD-10-CM | POA: Diagnosis not present

## 2023-01-11 DIAGNOSIS — M069 Rheumatoid arthritis, unspecified: Secondary | ICD-10-CM | POA: Diagnosis not present

## 2023-01-11 DIAGNOSIS — R7303 Prediabetes: Secondary | ICD-10-CM | POA: Diagnosis not present

## 2023-01-11 DIAGNOSIS — E785 Hyperlipidemia, unspecified: Secondary | ICD-10-CM | POA: Diagnosis not present

## 2023-01-11 DIAGNOSIS — M3500 Sicca syndrome, unspecified: Secondary | ICD-10-CM | POA: Diagnosis not present

## 2023-01-17 DIAGNOSIS — Z0000 Encounter for general adult medical examination without abnormal findings: Secondary | ICD-10-CM | POA: Diagnosis not present

## 2023-01-17 DIAGNOSIS — D638 Anemia in other chronic diseases classified elsewhere: Secondary | ICD-10-CM | POA: Diagnosis not present

## 2023-01-17 DIAGNOSIS — Z136 Encounter for screening for cardiovascular disorders: Secondary | ICD-10-CM | POA: Diagnosis not present

## 2023-01-17 DIAGNOSIS — J8410 Pulmonary fibrosis, unspecified: Secondary | ICD-10-CM | POA: Diagnosis not present

## 2023-01-17 DIAGNOSIS — Z7952 Long term (current) use of systemic steroids: Secondary | ICD-10-CM | POA: Diagnosis not present

## 2023-01-17 DIAGNOSIS — R7303 Prediabetes: Secondary | ICD-10-CM | POA: Diagnosis not present

## 2023-01-17 DIAGNOSIS — M0579 Rheumatoid arthritis with rheumatoid factor of multiple sites without organ or systems involvement: Secondary | ICD-10-CM | POA: Diagnosis not present

## 2023-03-08 DIAGNOSIS — M79674 Pain in right toe(s): Secondary | ICD-10-CM | POA: Diagnosis not present

## 2023-03-08 DIAGNOSIS — B351 Tinea unguium: Secondary | ICD-10-CM | POA: Diagnosis not present

## 2023-03-08 DIAGNOSIS — M79675 Pain in left toe(s): Secondary | ICD-10-CM | POA: Diagnosis not present

## 2023-03-16 DIAGNOSIS — H5203 Hypermetropia, bilateral: Secondary | ICD-10-CM | POA: Diagnosis not present

## 2023-03-16 DIAGNOSIS — E119 Type 2 diabetes mellitus without complications: Secondary | ICD-10-CM | POA: Diagnosis not present

## 2023-03-16 DIAGNOSIS — H1045 Other chronic allergic conjunctivitis: Secondary | ICD-10-CM | POA: Diagnosis not present

## 2023-03-16 DIAGNOSIS — M3500 Sicca syndrome, unspecified: Secondary | ICD-10-CM | POA: Diagnosis not present

## 2023-03-23 DIAGNOSIS — R059 Cough, unspecified: Secondary | ICD-10-CM | POA: Diagnosis not present

## 2023-03-23 DIAGNOSIS — R509 Fever, unspecified: Secondary | ICD-10-CM | POA: Diagnosis not present

## 2023-03-29 ENCOUNTER — Ambulatory Visit (INDEPENDENT_AMBULATORY_CARE_PROVIDER_SITE_OTHER): Payer: No Typology Code available for payment source | Admitting: Vascular Surgery

## 2023-03-29 ENCOUNTER — Ambulatory Visit (INDEPENDENT_AMBULATORY_CARE_PROVIDER_SITE_OTHER): Payer: No Typology Code available for payment source

## 2023-03-29 VITALS — BP 112/70 | HR 79 | Resp 18 | Ht <= 58 in | Wt 77.0 lb

## 2023-03-29 DIAGNOSIS — I739 Peripheral vascular disease, unspecified: Secondary | ICD-10-CM | POA: Diagnosis not present

## 2023-03-29 DIAGNOSIS — I7300 Raynaud's syndrome without gangrene: Secondary | ICD-10-CM

## 2023-03-29 DIAGNOSIS — R7303 Prediabetes: Secondary | ICD-10-CM

## 2023-03-29 NOTE — Progress Notes (Signed)
MRN : [id number]  Angelica Whitney is a 82 y.o. (August 31, 1940) female who presents with chief complaint of  Chief Complaint  Patient presents with   Venous Insufficiency  .  History of Present Illness: Patient returns today in follow up of PAD.  She still has some mild claudication symptoms and significant Raynaud's disease which she controls reasonably well by avoidance of cold stimulation.  No new ulceration or infection.  No fever or chills.  Her ABIs today are stable at 0.86 on the right and 0.95 on the left with multiphasic waveforms.  Current Outpatient Medications  Medication Sig Dispense Refill   Artificial Saliva (SALIVAMAX) PACK   2   Ascorbic Acid (VITAMIN C) 1000 MG tablet Take 1,000 mg by mouth daily.     aspirin 81 MG chewable tablet Chew 81 mg by mouth daily.     bisacodyl (DULCOLAX) 5 MG EC tablet Take by mouth.     Liniments (SALONPAS PAIN RELIEF PATCH EX) Apply 1 patch topically daily as needed.     meclizine (ANTIVERT) 12.5 MG tablet Take 12.5 mg by mouth 3 (three) times daily as needed.     Multiple Vitamins-Minerals (MULTIVITAMIN WITH MINERALS) tablet Take 1 tablet by mouth daily.     predniSONE (DELTASONE) 5 MG tablet 5 mg.     Calcium Carbonate-Vitamin D 600-400 MG-UNIT tablet Take 2 tablets by mouth daily. (Patient not taking: Reported on 03/29/2023)     HYDROcodone-acetaminophen (NORCO) 5-325 MG tablet Take 1 tablet by mouth every 4 (four) hours as needed for moderate pain. (Patient not taking: Reported on 08/25/2017) 30 tablet 0   prednisoLONE acetate (PRED FORTE) 1 % ophthalmic suspension INSTILL 1 DROP TO LEFT EYE 4 TIMES DAILY X1WK, 1 DROP TWICE DAILY X 1WK, 1 DROP DAILY X1WK, STOP (Patient not taking: Reported on 03/30/2022)  0   No current facility-administered medications for this visit.    Past Medical History:  Diagnosis Date   Allergy    Arthritis    Cataract    Heart murmur    Rheumatic aortic insufficiency    rheumatic fever as a child   Sjogren's  syndrome [facility])     Past Surgical History:  Procedure Laterality Date   ABDOMINAL HYSTERECTOMY     BREAST BIOPSY Left 2007   benign   COLONOSCOPY WITH PROPOFOL N/A 04/29/2017   Procedure: COLONOSCOPY WITH PROPOFOL;  Surgeon: Christena Deem, MD;  Location: [facility] ENDOSCOPY;  Service: Endoscopy;  Laterality: N/A;   GANGLION CYST EXCISION Right 08/06/2016   Procedure: REMOVAL GANGLION CYST FOOT;  Surgeon: Linus Galas, DPM;  Location: [facility] ORS;  Service: Podiatry;  Laterality: Right;     Social History   Tobacco Use   Smoking status: Former    Current packs/day: 0.00    Types: Cigarettes    Quit date: 07/27/1985    Years since quitting: 37.6   Smokeless tobacco: Never  Vaping Use   Vaping status: Never Used  Substance Use Topics   Alcohol use: No   Drug use: No      Family History  Problem Relation Age of Onset   Breast cancer Sister 63   Cancer Sister    Breast cancer Maternal Aunt 45   Cancer Maternal Aunt      Allergies  Allergen Reactions   Caffeine Other (See Comments)    Kept her awake   Cevimeline Other (See Comments)    "Disabled her"   Leflunomide Other (See Comments)    Other  reaction(s): Dizziness Migraines as well.   Levofloxacin Other (See Comments)    "Disabled her"    Tilactase    Codeine Nausea And Vomiting and Anxiety   Ibuprofen Other (See Comments)    800mg  tablet only- caused her to not be able to move "every joint, every muscle froze up" States she can take 4 200 mg tabs without problems. 800mg  tablet only- caused her to not be able to move "every joint, every muscle froze up" States she can take 4 200 mg tabs without problems.      REVIEW OF SYSTEMS (Negative unless checked)  Constitutional: [] Weight loss  [] Fever  [] Chills Cardiac: [] Chest pain   [] Chest pressure   [] Palpitations   [] Shortness of breath when laying flat   [] Shortness of breath at rest   [] Shortness of breath with exertion. Vascular:  [] Pain in legs with walking    [] Pain in legs at rest   [] Pain in legs when laying flat   [] Claudication   [] Pain in feet when walking  [] Pain in feet at rest  [] Pain in feet when laying flat   [] History of DVT   [] Phlebitis   [] Swelling in legs   [] Varicose veins   [] Non-healing ulcers Pulmonary:   [] Uses home oxygen   [] Productive cough   [] Hemoptysis   [] Wheeze  [] COPD   [] Asthma Neurologic:  [] Dizziness  [] Blackouts   [] Seizures   [] History of stroke   [] History of TIA  [] Aphasia   [] Temporary blindness   [] Dysphagia   [] Weakness or numbness in arms   [] Weakness or numbness in legs Musculoskeletal:  [x] Arthritis   [] Joint swelling   [x] Joint pain   [] Low back pain Hematologic:  [] Easy bruising  [] Easy bleeding   [] Hypercoagulable state   [] Anemic   Gastrointestinal:  [] Blood in stool   [] Vomiting blood  [] Gastroesophageal reflux/heartburn   [] Abdominal pain Genitourinary:  [] Chronic kidney disease   [] Difficult urination  [] Frequent urination  [] Burning with urination   [] Hematuria Skin:  [] Rashes   [] Ulcers   [] Wounds Psychological:  [] History of anxiety   []  History of major depression.  Physical Examination  BP 112/70 (BP Location: Left Arm)   Pulse 79   Resp 18   Ht 4\' 9"  (1.448 m)   Wt 77 lb (34.9 kg)   BMI 16.66 kg/m  Gen:  NAD thin and frail appearing. Head: [place]/AT, + temporalis wasting. Ear/Nose/Throat: Hearing grossly intact, nares w/o erythema or drainage Eyes: Conjunctiva clear. Sclera non-icteric Neck: Supple.  Trachea midline Pulmonary:  Good air movement, no use of accessory muscles.  Cardiac: RRR, no JVD Vascular:  Vessel Right Left  Radial Palpable Palpable                          PT 1+ Palpable 1+ Palpable  DP 1+ Palpable 2+ Palpable   Gastrointestinal: soft, non-tender/non-distended. No guarding/reflex.  Musculoskeletal: M/S 5/5 throughout.  No deformity or atrophy. No LE edema. Neurologic: Sensation grossly intact in extremities.  Symmetrical.  Speech is fluent.  Psychiatric:  Judgment intact, Mood & affect appropriate for pt's clinical situation. Dermatologic: No rashes or ulcers noted.  No cellulitis or open wounds.     Labs No results found for this or any previous visit (from the past 2160 hour(s)).  Radiology No results found.  Assessment/Plan Raynaud's disease Symptoms not currently that bothersome.   Borderline diabetes blood glucose control important in reducing the progression of atherosclerotic disease. Also, involved in wound healing. On  appropriate medications.  Peripheral artery disease (HCC) Her ABIs today are stable at 0.86 on the right and 0.95 on the left with multiphasic waveforms.  No worrisome symptoms.  No role for intervention.  Continue aspirin.  Follow-up in 1 year.    Festus Barren, MD  03/29/2023 3:28 PM    This note was created with Dragon medical transcription system.  Any errors from dictation are purely unintentional

## 2023-03-29 NOTE — Assessment & Plan Note (Signed)
Her ABIs today are stable at 0.86 on the right and 0.95 on the left with multiphasic waveforms.  No worrisome symptoms.  No role for intervention.  Continue aspirin.  Follow-up in 1 year.

## 2023-03-30 LAB — VAS US ABI WITH/WO TBI
Left ABI: 0.95
Right ABI: 0.86

## 2023-06-20 DIAGNOSIS — L299 Pruritus, unspecified: Secondary | ICD-10-CM | POA: Diagnosis not present

## 2023-07-19 DIAGNOSIS — D638 Anemia in other chronic diseases classified elsewhere: Secondary | ICD-10-CM | POA: Diagnosis not present

## 2023-07-19 DIAGNOSIS — R7303 Prediabetes: Secondary | ICD-10-CM | POA: Diagnosis not present

## 2023-07-19 DIAGNOSIS — Z136 Encounter for screening for cardiovascular disorders: Secondary | ICD-10-CM | POA: Diagnosis not present

## 2023-07-19 DIAGNOSIS — Z0000 Encounter for general adult medical examination without abnormal findings: Secondary | ICD-10-CM | POA: Diagnosis not present

## 2023-07-26 DIAGNOSIS — J8410 Pulmonary fibrosis, unspecified: Secondary | ICD-10-CM | POA: Diagnosis not present

## 2023-07-26 DIAGNOSIS — D638 Anemia in other chronic diseases classified elsewhere: Secondary | ICD-10-CM | POA: Diagnosis not present

## 2023-07-26 DIAGNOSIS — M0579 Rheumatoid arthritis with rheumatoid factor of multiple sites without organ or systems involvement: Secondary | ICD-10-CM | POA: Diagnosis not present

## 2023-07-26 DIAGNOSIS — R7303 Prediabetes: Secondary | ICD-10-CM | POA: Diagnosis not present

## 2023-07-26 DIAGNOSIS — I739 Peripheral vascular disease, unspecified: Secondary | ICD-10-CM | POA: Diagnosis not present

## 2023-07-26 DIAGNOSIS — Z7960 Long term (current) use of unspecified immunomodulators and immunosuppressants: Secondary | ICD-10-CM | POA: Diagnosis not present

## 2023-08-16 DIAGNOSIS — R2681 Unsteadiness on feet: Secondary | ICD-10-CM | POA: Diagnosis not present

## 2023-08-16 DIAGNOSIS — Z681 Body mass index (BMI) 19 or less, adult: Secondary | ICD-10-CM | POA: Diagnosis not present

## 2023-08-16 DIAGNOSIS — J439 Emphysema, unspecified: Secondary | ICD-10-CM | POA: Diagnosis not present

## 2023-08-16 DIAGNOSIS — I2510 Atherosclerotic heart disease of native coronary artery without angina pectoris: Secondary | ICD-10-CM | POA: Diagnosis not present

## 2023-08-16 DIAGNOSIS — J8410 Pulmonary fibrosis, unspecified: Secondary | ICD-10-CM | POA: Diagnosis not present

## 2023-08-16 DIAGNOSIS — R636 Underweight: Secondary | ICD-10-CM | POA: Diagnosis not present

## 2023-08-16 DIAGNOSIS — R7303 Prediabetes: Secondary | ICD-10-CM | POA: Diagnosis not present

## 2023-08-16 DIAGNOSIS — Z008 Encounter for other general examination: Secondary | ICD-10-CM | POA: Diagnosis not present

## 2023-08-16 DIAGNOSIS — M810 Age-related osteoporosis without current pathological fracture: Secondary | ICD-10-CM | POA: Diagnosis not present

## 2023-08-22 DIAGNOSIS — B351 Tinea unguium: Secondary | ICD-10-CM | POA: Diagnosis not present

## 2023-08-22 DIAGNOSIS — M79675 Pain in left toe(s): Secondary | ICD-10-CM | POA: Diagnosis not present

## 2023-08-22 DIAGNOSIS — M79674 Pain in right toe(s): Secondary | ICD-10-CM | POA: Diagnosis not present

## 2023-10-04 DIAGNOSIS — M0579 Rheumatoid arthritis with rheumatoid factor of multiple sites without organ or systems involvement: Secondary | ICD-10-CM | POA: Diagnosis not present

## 2023-10-04 DIAGNOSIS — R7303 Prediabetes: Secondary | ICD-10-CM | POA: Diagnosis not present

## 2023-10-04 DIAGNOSIS — I739 Peripheral vascular disease, unspecified: Secondary | ICD-10-CM | POA: Diagnosis not present

## 2023-10-26 DIAGNOSIS — M0579 Rheumatoid arthritis with rheumatoid factor of multiple sites without organ or systems involvement: Secondary | ICD-10-CM | POA: Diagnosis not present

## 2023-10-26 DIAGNOSIS — Z7952 Long term (current) use of systemic steroids: Secondary | ICD-10-CM | POA: Diagnosis not present

## 2023-10-26 DIAGNOSIS — J8410 Pulmonary fibrosis, unspecified: Secondary | ICD-10-CM | POA: Diagnosis not present

## 2023-11-14 DIAGNOSIS — R051 Acute cough: Secondary | ICD-10-CM | POA: Diagnosis not present

## 2024-02-01 DIAGNOSIS — D638 Anemia in other chronic diseases classified elsewhere: Secondary | ICD-10-CM | POA: Diagnosis not present

## 2024-02-01 DIAGNOSIS — R7303 Prediabetes: Secondary | ICD-10-CM | POA: Diagnosis not present

## 2024-02-08 DIAGNOSIS — Z0000 Encounter for general adult medical examination without abnormal findings: Secondary | ICD-10-CM | POA: Diagnosis not present

## 2024-02-08 DIAGNOSIS — M0579 Rheumatoid arthritis with rheumatoid factor of multiple sites without organ or systems involvement: Secondary | ICD-10-CM | POA: Diagnosis not present

## 2024-02-08 DIAGNOSIS — M79674 Pain in right toe(s): Secondary | ICD-10-CM | POA: Diagnosis not present

## 2024-02-08 DIAGNOSIS — Z1331 Encounter for screening for depression: Secondary | ICD-10-CM | POA: Diagnosis not present

## 2024-02-08 DIAGNOSIS — M79675 Pain in left toe(s): Secondary | ICD-10-CM | POA: Diagnosis not present

## 2024-02-08 DIAGNOSIS — R7303 Prediabetes: Secondary | ICD-10-CM | POA: Diagnosis not present

## 2024-02-08 DIAGNOSIS — B351 Tinea unguium: Secondary | ICD-10-CM | POA: Diagnosis not present

## 2024-02-08 DIAGNOSIS — Z136 Encounter for screening for cardiovascular disorders: Secondary | ICD-10-CM | POA: Diagnosis not present

## 2024-02-08 DIAGNOSIS — D638 Anemia in other chronic diseases classified elsewhere: Secondary | ICD-10-CM | POA: Diagnosis not present

## 2024-04-03 ENCOUNTER — Encounter (INDEPENDENT_AMBULATORY_CARE_PROVIDER_SITE_OTHER): Payer: No Typology Code available for payment source

## 2024-04-03 ENCOUNTER — Ambulatory Visit (INDEPENDENT_AMBULATORY_CARE_PROVIDER_SITE_OTHER): Payer: No Typology Code available for payment source | Admitting: Nurse Practitioner

## 2024-04-26 ENCOUNTER — Other Ambulatory Visit (INDEPENDENT_AMBULATORY_CARE_PROVIDER_SITE_OTHER): Payer: Self-pay | Admitting: Vascular Surgery

## 2024-04-26 DIAGNOSIS — I739 Peripheral vascular disease, unspecified: Secondary | ICD-10-CM

## 2024-04-27 ENCOUNTER — Ambulatory Visit (INDEPENDENT_AMBULATORY_CARE_PROVIDER_SITE_OTHER)

## 2024-04-27 ENCOUNTER — Ambulatory Visit (INDEPENDENT_AMBULATORY_CARE_PROVIDER_SITE_OTHER): Admitting: Nurse Practitioner

## 2024-04-27 DIAGNOSIS — E7800 Pure hypercholesterolemia, unspecified: Secondary | ICD-10-CM | POA: Diagnosis not present

## 2024-04-27 DIAGNOSIS — M1380 Other specified arthritis, unspecified site: Secondary | ICD-10-CM | POA: Diagnosis not present

## 2024-04-27 DIAGNOSIS — I739 Peripheral vascular disease, unspecified: Secondary | ICD-10-CM

## 2024-04-30 ENCOUNTER — Encounter (INDEPENDENT_AMBULATORY_CARE_PROVIDER_SITE_OTHER): Payer: Self-pay | Admitting: Nurse Practitioner

## 2024-04-30 DIAGNOSIS — Z7952 Long term (current) use of systemic steroids: Secondary | ICD-10-CM | POA: Diagnosis not present

## 2024-04-30 DIAGNOSIS — M79605 Pain in left leg: Secondary | ICD-10-CM | POA: Diagnosis not present

## 2024-04-30 DIAGNOSIS — M0579 Rheumatoid arthritis with rheumatoid factor of multiple sites without organ or systems involvement: Secondary | ICD-10-CM | POA: Diagnosis not present

## 2024-04-30 NOTE — Progress Notes (Signed)
 Subjective:    Patient ID: Angelica Whitney, female    DOB: 12-22-1940, 83 y.o.   MRN: [id number] No chief complaint on file.   The patient returns to the office for followup regarding atherosclerotic changes of the lower extremities and review of the noninvasive studies.   There have been no interval changes in lower extremity symptoms. No interval shortening of the patient's claudication distance or development of rest pain symptoms. No new ulcers or wounds have occurred since the last visit.  She does however complain of pain in her left leg.  She notes that it is worse when she is standing.  Not necessarily with activity.  She does have significant arthritic issues.  She notes that it feels better when she was on her back and pulls her leg up to her chest.  The symptoms are not made better or worse with activity and they tend to be nonvariable.  There have been no significant changes to the patient's overall health care.  The patient denies amaurosis fugax or recent TIA symptoms. There are no documented recent neurological changes noted. There is no history of DVT, PE or superficial thrombophlebitis. The patient denies recent episodes of angina or shortness of breath.   ABI Rt=0.73 and Lt=0.82  (previous ABI's Rt=0.86 and Lt=0.95) TBIs have improved bilaterally Duplex ultrasound of the bilateral tibial vessels shows biphasic waveforms with 240    Review of Systems  Musculoskeletal:  Positive for arthralgias.  All other systems reviewed and are negative.      Objective:   Physical Exam Vitals reviewed.  HENT:     Head: Normocephalic.  Cardiovascular:     Rate and Rhythm: Normal rate.  Pulmonary:     Effort: Pulmonary effort is normal.  Skin:    General: Skin is warm and dry.  Neurological:     Mental Status: She is alert and oriented to person, place, and time.  Psychiatric:        Mood and Affect: Mood normal.        Behavior: Behavior normal.        Thought Content:  Thought content normal.        Judgment: Judgment normal.     There were no vitals taken for this visit.  Past Medical History:  Diagnosis Date   Allergy    Arthritis    Cataract    Heart murmur    Rheumatic aortic insufficiency    rheumatic fever as a child   Sjogren's syndrome     Social History   Socioeconomic History   Marital status: Divorced    Spouse name: Not on file   Number of children: Not on file   Years of education: Not on file   Highest education level: Not on file  Occupational History   Not on file  Tobacco Use   Smoking status: Former    Current packs/day: 0.00    Types: Cigarettes    Quit date: 07/27/1985    Years since quitting: 38.7   Smokeless tobacco: Never  Vaping Use   Vaping status: Never Used  Substance and Sexual Activity   Alcohol use: No   Drug use: No   Sexual activity: Never  Other Topics Concern   Not on file  Social History Narrative   Not on file   Social Drivers of Health   Financial Resource Strain: Low Risk  (01/17/2023)   Received from [facility]   Overall Financial Resource Strain (CARDIA)  Difficulty of Paying Living Expenses: Not hard at all  Food Insecurity: No Food Insecurity (01/17/2023)   Received from [facility]   Hunger Vital Sign    Within the past 12 months, you worried that your food would run out before you got the money to buy more.: Never true    Within the past 12 months, the food you bought just didn't last and you didn't have money to get more.: Never true  Transportation Needs: No Transportation Needs (01/17/2023)   Received from [facility] - Transportation    In the past 12 months, has lack of transportation kept you from medical appointments or from getting medications?: No    Lack of Transportation (Non-Medical): No  Physical Activity: Not on file  Stress: Not on file  Social Connections: Not on file  Intimate Partner Violence: Not  on file    Past Surgical History:  Procedure Laterality Date   ABDOMINAL HYSTERECTOMY     BREAST BIOPSY Left 2007   benign   COLONOSCOPY WITH PROPOFOL  N/A 04/29/2017   Procedure: COLONOSCOPY WITH PROPOFOL ;  Surgeon: Gaylyn Gladis PENNER, MD;  Location: [facility] ENDOSCOPY;  Service: Endoscopy;  Laterality: N/A;   GANGLION CYST EXCISION Right 08/06/2016   Procedure: REMOVAL GANGLION CYST FOOT;  Surgeon: Krystal Rosella, DPM;  Location: [facility] ORS;  Service: Podiatry;  Laterality: Right;    Family History  Problem Relation Age of Onset   Breast cancer Sister 1   Cancer Sister    Breast cancer Maternal Aunt 45   Cancer Maternal Aunt     Allergies  Allergen Reactions   Caffeine Other (See Comments)    Kept her awake   Cevimeline Other (See Comments)    Disabled her   Leflunomide Other (See Comments)    Other reaction(s): Dizziness Migraines as well.   Levofloxacin Other (See Comments)    Disabled her    Tilactase    Codeine Nausea And Vomiting and Anxiety   Ibuprofen Other (See Comments)    800mg  tablet only- caused her to not be able to move every joint, every muscle froze up States she can take 4 200 mg tabs without problems. 800mg  tablet only- caused her to not be able to move every joint, every muscle froze up States she can take 4 200 mg tabs without problems.        Latest Ref Rng & Units 10/15/2019    5:08 PM 02/23/2018   10:12 AM 11/22/2017    1:15 PM  CBC  WBC 3.4 - 10.8 x10E3/uL 5.9  4.1  6.2   Hemoglobin 11.1 - 15.9 g/dL 89.4  88.0  88.2   Hematocrit 34.0 - 46.6 % 31.1  35.2  34.7   Platelets 150 - 450 x10E3/uL 268  198  247       CMP     Component Value Date/Time   NA 135 02/23/2018 1012   NA 135 (L) 03/24/2014 1350   K 3.9 02/23/2018 1012   K 3.8 03/24/2014 1350   CL 99 02/23/2018 1012   CL 104 03/24/2014 1350   CO2 28 02/23/2018 1012   CO2 26 03/24/2014 1350   GLUCOSE 72 02/23/2018 1012   GLUCOSE 100 (H) 03/24/2014 1350   BUN 11 02/23/2018 1012    BUN 9 03/24/2014 1350   CREATININE 0.86 02/23/2018 1012   CREATININE 0.88 03/24/2014 1350   CALCIUM 9.4 02/23/2018 1012   CALCIUM 8.3 (L) 03/24/2014 1350   PROT  8.6 (H) 01/15/2020 1620   ALBUMIN 3.9 01/15/2020 1620   AST 33 01/15/2020 1620   ALT 21 01/15/2020 1620   ALKPHOS 73 01/15/2020 1620   BILITOT 0.6 01/15/2020 1620   GFRNONAA >60 02/23/2018 1012   GFRNONAA >60 03/24/2014 1350     No results found.     Assessment & Plan:   1. Peripheral artery disease (Primary) The patient's studies have decreased slightly but I do not believe is causing her any symptoms.  What she is describing would be tantamount to rest pain.  However she is actually an improvement in her TBI's.  In addition some of her relieving factors such as certain stretches and movements increase fiber belief that she is dealing with arthritis versus decreased perfusion as a source of her pain.  She will be seeing her specialist soon for arthritis.  I discussed that if for what ever reason their interventions are not effective possibly pain we can move forward with an angiogram.  She will contact as to follow-up if she wishes to move forward with angiogram otherwise we will have the patient return in 1 year.  2. Inflammatory arthritis The patient does have significant arthritis.  I believe this is the source of her discomfort.  3. Pure hypercholesterolemia Continue statin as ordered and reviewed, no changes at this time   Current Outpatient Medications on File Prior to Visit  Medication Sig Dispense Refill   Artificial Saliva (SALIVAMAX) PACK   2   Ascorbic Acid (VITAMIN C) 1000 MG tablet Take 1,000 mg by mouth daily.     aspirin 81 MG chewable tablet Chew 81 mg by mouth daily.     bisacodyl (DULCOLAX) 5 MG EC tablet Take by mouth.     Calcium Carbonate-Vitamin D 600-400 MG-UNIT tablet Take 2 tablets by mouth daily. (Patient not taking: Reported on 03/29/2023)     HYDROcodone -acetaminophen  (NORCO) 5-325 MG tablet  Take 1 tablet by mouth every 4 (four) hours as needed for moderate pain. (Patient not taking: Reported on 08/25/2017) 30 tablet 0   Liniments (SALONPAS PAIN RELIEF PATCH EX) Apply 1 patch topically daily as needed.     meclizine (ANTIVERT) 12.5 MG tablet Take 12.5 mg by mouth 3 (three) times daily as needed.     Multiple Vitamins-Minerals (MULTIVITAMIN WITH MINERALS) tablet Take 1 tablet by mouth daily.     prednisoLONE acetate (PRED FORTE) 1 % ophthalmic suspension INSTILL 1 DROP TO LEFT EYE 4 TIMES DAILY X1WK, 1 DROP TWICE DAILY X 1WK, 1 DROP DAILY X1WK, STOP (Patient not taking: Reported on 03/30/2022)  0   predniSONE (DELTASONE) 5 MG tablet 5 mg.     No current facility-administered medications on file prior to visit.    There are no Patient Instructions on file for this visit. No follow-ups on file.   Lashuna Tamashiro E Dat Derksen, NP

## 2024-05-01 LAB — VAS US ABI WITH/WO TBI
Left ABI: 0.82
Right ABI: 0.73

## 2024-05-24 ENCOUNTER — Other Ambulatory Visit: Payer: Self-pay | Admitting: Orthopedic Surgery

## 2024-05-24 DIAGNOSIS — M4807 Spinal stenosis, lumbosacral region: Secondary | ICD-10-CM

## 2024-05-31 ENCOUNTER — Ambulatory Visit
Admission: RE | Admit: 2024-05-31 | Discharge: 2024-05-31 | Disposition: A | Discharge status: Home / Self Care | Source: Ambulatory Visit | Attending: Orthopedic Surgery | Admitting: Orthopedic Surgery

## 2024-05-31 DIAGNOSIS — M4807 Spinal stenosis, lumbosacral region: Secondary | ICD-10-CM | POA: Insufficient documentation
